# Patient Record
Sex: Male | Born: 1960 | Race: White | Hispanic: No | Marital: Single | Smoking: Current every day smoker
Health system: Southern US, Community
[De-identification: ages and names within clinical notes are randomized; demographics above are authoritative.]

## PROBLEM LIST (undated history)

## (undated) DIAGNOSIS — F329 Major depressive disorder, single episode, unspecified: Secondary | ICD-10-CM

## (undated) DIAGNOSIS — E785 Hyperlipidemia, unspecified: Secondary | ICD-10-CM

## (undated) DIAGNOSIS — K219 Gastro-esophageal reflux disease without esophagitis: Secondary | ICD-10-CM

## (undated) DIAGNOSIS — F32A Depression, unspecified: Secondary | ICD-10-CM

## (undated) DIAGNOSIS — H269 Unspecified cataract: Secondary | ICD-10-CM

## (undated) DIAGNOSIS — F419 Anxiety disorder, unspecified: Secondary | ICD-10-CM

## (undated) DIAGNOSIS — I10 Essential (primary) hypertension: Secondary | ICD-10-CM

## (undated) HISTORY — DX: Essential (primary) hypertension: I10

## (undated) HISTORY — DX: Depression, unspecified: F32.A

## (undated) HISTORY — DX: Hyperlipidemia, unspecified: E78.5

## (undated) HISTORY — DX: Major depressive disorder, single episode, unspecified: F32.9

## (undated) HISTORY — DX: Gastro-esophageal reflux disease without esophagitis: K21.9

## (undated) HISTORY — DX: Anxiety disorder, unspecified: F41.9

## (undated) HISTORY — DX: Unspecified cataract: H26.9

---

## 2010-04-06 ENCOUNTER — Emergency Department (HOSPITAL_COMMUNITY)
Admission: EM | Admit: 2010-04-06 | Discharge: 2010-04-06 | Disposition: A | Discharge status: Home / Self Care | Payer: PRIVATE HEALTH INSURANCE | Attending: Emergency Medicine | Admitting: Emergency Medicine

## 2010-04-06 DIAGNOSIS — T07XXXA Unspecified multiple injuries, initial encounter: Secondary | ICD-10-CM | POA: Insufficient documentation

## 2010-04-06 DIAGNOSIS — S91009A Unspecified open wound, unspecified ankle, initial encounter: Secondary | ICD-10-CM | POA: Insufficient documentation

## 2010-04-06 DIAGNOSIS — IMO0001 Reserved for inherently not codable concepts without codable children: Secondary | ICD-10-CM | POA: Insufficient documentation

## 2010-04-06 DIAGNOSIS — S81009A Unspecified open wound, unspecified knee, initial encounter: Secondary | ICD-10-CM | POA: Insufficient documentation

## 2010-06-09 ENCOUNTER — Emergency Department (HOSPITAL_COMMUNITY): Payer: PRIVATE HEALTH INSURANCE

## 2010-06-09 ENCOUNTER — Observation Stay (HOSPITAL_COMMUNITY)
Admission: EM | Admit: 2010-06-09 | Discharge: 2010-06-10 | DRG: 206 | Disposition: A | Discharge status: Home / Self Care | Payer: PRIVATE HEALTH INSURANCE | Attending: Family Medicine | Admitting: Family Medicine

## 2010-06-09 DIAGNOSIS — J4489 Other specified chronic obstructive pulmonary disease: Secondary | ICD-10-CM | POA: Insufficient documentation

## 2010-06-09 DIAGNOSIS — I10 Essential (primary) hypertension: Secondary | ICD-10-CM | POA: Insufficient documentation

## 2010-06-09 DIAGNOSIS — M940 Chondrocostal junction syndrome [Tietze]: Principal | ICD-10-CM | POA: Insufficient documentation

## 2010-06-09 DIAGNOSIS — J449 Chronic obstructive pulmonary disease, unspecified: Secondary | ICD-10-CM | POA: Insufficient documentation

## 2010-06-09 DIAGNOSIS — F17200 Nicotine dependence, unspecified, uncomplicated: Secondary | ICD-10-CM | POA: Insufficient documentation

## 2010-06-09 DIAGNOSIS — R0789 Other chest pain: Secondary | ICD-10-CM

## 2010-06-09 DIAGNOSIS — R079 Chest pain, unspecified: Secondary | ICD-10-CM | POA: Insufficient documentation

## 2010-06-09 DIAGNOSIS — E785 Hyperlipidemia, unspecified: Secondary | ICD-10-CM | POA: Insufficient documentation

## 2010-06-09 LAB — COMPREHENSIVE METABOLIC PANEL
ALT: 23 U/L (ref 0–53)
AST: 24 U/L (ref 0–37)
Alkaline Phosphatase: 96 U/L (ref 39–117)
CO2: 32 mEq/L (ref 19–32)
GFR calc Af Amer: >60 mL/min (ref >60)
GFR calc non Af Amer: >60 mL/min (ref >60)
Glucose, Bld: 101 mg/dL — ABNORMAL HIGH (ref 70–99)
Potassium: 3.8 mEq/L (ref 3.5–5.1)
Sodium: 140 mEq/L (ref 135–145)
Total Protein: 6.8 g/dL (ref 6.0–8.3)

## 2010-06-09 LAB — CK TOTAL AND CKMB (NOT AT ARMC)
Relative Index: 1 (ref 0.0–2.5)
Total CK: 113 U/L (ref 7–232)

## 2010-06-09 LAB — CBC
HCT: 42.1 % (ref 39.0–52.0)
MCHC: 35.4 g/dL (ref 30.0–36.0)
MCV: 90.9 fL (ref 78.0–100.0)
RDW: 13.6 % (ref 11.5–15.5)

## 2010-06-09 LAB — POCT CARDIAC MARKERS

## 2010-06-09 LAB — TROPONIN I: Troponin I: <0.3 ng/mL (ref <0.30)

## 2010-06-10 LAB — BASIC METABOLIC PANEL
BUN: 12 mg/dL (ref 6–23)
Chloride: 105 mEq/L (ref 96–112)
GFR calc Af Amer: >60 mL/min (ref >60)
GFR calc non Af Amer: >60 mL/min (ref >60)
Potassium: 3.8 mEq/L (ref 3.5–5.1)
Sodium: 140 mEq/L (ref 135–145)

## 2010-06-10 LAB — LIPID PANEL
Cholesterol: 189 mg/dL (ref 0–200)
Total CHOL/HDL Ratio: 7 RATIO
VLDL: 38 mg/dL (ref 0–40)

## 2010-06-10 LAB — CBC
HCT: 40.6 % (ref 39.0–52.0)
MCHC: 34.5 g/dL (ref 30.0–36.0)
RDW: 13.6 % (ref 11.5–15.5)
WBC: 9.9 10*3/uL (ref 4.0–10.5)

## 2010-06-10 LAB — CARDIAC PANEL(CRET KIN+CKTOT+MB+TROPI)
Relative Index: INVALID (ref 0.0–2.5)
Total CK: 98 U/L (ref 7–232)
Troponin I: <0.3 ng/mL (ref <0.30)

## 2010-06-10 LAB — HEMOGLOBIN A1C
Hgb A1c MFr Bld: 5.3 % (ref <5.7)
Mean Plasma Glucose: 105 mg/dL (ref <117)

## 2010-06-11 NOTE — Discharge Summary (Signed)
[name], [name]                ACCOUNT NO.:  [id number]  MEDICAL RECORD NO.:  [id number]           PATIENT TYPE:  I  LOCATION:  3732                         FACILITY:  [facility]  PHYSICIAN:  Leighton Roach Fatim Vanderschaaf, M.D.DATE OF BIRTH:  [birth year]  DATE OF ADMISSION:  06/09/2010 DATE OF DISCHARGE:  06/10/2010                              DISCHARGE SUMMARY   PRIMARY CARE PROVIDER:  None.  The patient was seen at [facility] 2 years ago.  REASON FOR HOSPITALIZATION:  Chest pain, rule out acute coronary syndrome.  DISCHARGE DIAGNOSES: 1. Costochondritis. 2. Hypertension.  MEDICATIONS:  New medications: 1. Hydrochlorothiazide 12.5 mg p.o. q.a.m. 2. Nitroglycerin sublingual 0.4 mg p.r.n. chest pain.  May repeat     every 5 minutes up to three times. 3. Nicotine 21 mg per 24-hour patch transdermally daily.  Continue home medications: 1. Aspirin 81 mg p.o. daily 2. Fish oil 1000 mg p.o. daily. 3. Multivitamins 1 tablet p.o. daily.  PROCEDURES:  Chest x-ray showed COPD with no acute cardiopulmonary disease.  PERTINENT LABORATORY DATA:  Point-of-care cardiac markers negative x2. Cardiac enzymes negative x2.  Hemoglobin A1c 5.3.  Lipid profile, cholesterol 189, triglycerides 191, HDL 27, LDL 124, VLDL 38.  BRIEF HOSPITAL COURSE:  This is a 50 year old male with a history of tobacco abuse and hyperlipidemia who presented with chest pain and pressure. 1. Chest pain and pressure.  Likely secondary to costochondritis.     Acute coronary syndrome was ruled out with negative cardiac enzymes     x2 and negative point-of-care cardiac markers x2.  His ECG was     unchanged from admission and his ECGs x2 were unchanged.  The     patient presented with palpable and reproducible substernal pain     that was worse with right arm movement.  His symptoms were     attributed to costochondritis especially in light of his negative     ACS workup.  The patient has a history of trauma to that  area.  The     patient has a very active lifestyle including riding motorcycles     and go-kart.  The patient's pain was well controlled with p.r.n.     ibuprofen. 2. Questionable history of hypertension, and according to the patient     left ventricular enlargement diagnosed 3-4 years ago at a     cardiologist's office.  The patient was not on any antihypertensive     medications at the time of admission.  His blood pressures,     however, were elevated during his hospitalization 160s-170s over     90s-110s.  The patient was asked to start hydrochlorothiazide at     that time of admission and to follow up with his PCP regarding his     blood pressure.  The patient has a reported history of white coat     hypertension.  Since ACS was ruled out, the patient's left     ventricular hypertrophy was not dealt into.  The patient was     observed on telemetry overnight and did not have any events and his  ECG did not show signs of left ventricular hypertrophy.  The     patient was asked to follow up with his PCP regarding this issue     for a possible stress test. 3. Tobacco abuse.  The take the patient is a one pack-per-day smoker.     He was counseled multiple times during the admission regarding     this.  He did well on a nicotine patch during his hospitalization     and was given a prescription for this at the time of discharge. 4. COPD on chest x-ray.  The patient was asymptomatic.  We will notify     his PCP of this finding. 5. History of hyperlipidemia, intolerance of STATINS.  The patient's     lipid panel showed a moderately elevated LDL of 124.  Currently,     the patient is diet controlled for his hyperlipidemia.  The results     of this test will be known to his PCP.  DISCHARGE INSTRUCTIONS: 1. The patient was asked to follow up at [facility] in the     next 1-2 weeks. 2. The patient was advised to come to the emergency room if the     patient experienced any more  chest pain symptoms that is relieved     by the nitroglycerin for which he received a prescription.  FOLLOWUP ISSUES: 1. Please follow up on the patient's blood pressure.  He was started     on HCTZ after his hospitalization due to elevated blood pressures.     His potassium during his hospitalization was normal. 2. Please consider the patient for a stress test if one was not done     recently to completely rule out angina.    ______________________________ Priscella Mann, MD   ______________________________ Leighton Roach Isaac Lacson, M.D.    AO/MEDQ  D:  06/10/2010  T:  06/11/2010  Job:  [id number]  cc:   [facility] on [address].  Electronically Signed by Priscella Mann MD on 06/11/2010 03:25:59 PM Electronically Signed by Acquanetta Belling M.D. on 06/11/2010 03:53:27 PM

## 2010-06-17 NOTE — H&P (Signed)
NAME:  [name], [name] NO.:  [id number]  MEDICAL RECORD NO.:  [id number]           PATIENT TYPE:  I  LOCATION:  3732                         FACILITY:  [facility]  PHYSICIAN:  Paula Compton, MD        DATE OF BIRTH:  July 16, 1960  DATE OF ADMISSION:  06/09/2010 DATE OF DISCHARGE:                             HISTORY & PHYSICAL   PRIMARY CARE PHYSICIAN:  [facility].  CHIEF COMPLAINT:  Chest pain pressure.  HISTORY OF PRESENT ILLNESS:  The patient is a 50 year old man with past medical history of left ventricular hypertrophy and tobacco abuse who presented to the emergency department with chest pressure and pain.  The patient states that pain pressure/pressure is in sternum area, began around 12:30 when walking at work.  No shortness of breath with pain. No diaphoresis.  No nausea or vomiting.  Pain initially was rated at 6/10, lasted throughout the afternoon with no improvement, so he came to the emergency department for evaluation.  After nitro sublingual x1, minimal improvement in pain to 3/10.  Pain is worse with movement especially with reaching right arm across chest, some increase in the chest pressure with deep inspiration.  REVIEW OF SYSTEMS:  Negative for shortness of breath and diaphoresis. No fever.  No dry skin.  No urine problems.  No dark or bloody stools. No fever, no cough, no cold symptoms.  ALLERGIES:  No known drug allergies.  PAST MEDICAL HISTORY/SURGICAL HISTORY: 1. Hyperlipidemia.  The patient has tried to take statin in the past,     but stopped secondary to myalgias. 2. Enlarged left ventricle, diagnosed on stress test by Cardiology     approximately 3-4 years ago.  MEDICATIONS:  Aspirin daily.  FAMILY HISTORY:  Mother healthy.  Father with pancreatitis, alcoholic, tobacco abuse, and TIA.  Siblings healthy.  SOCIAL HISTORY:  One drink of alcohol every 3 months, 1-2 packs per day of cigarettes.  No drug use.  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  He is  single, has no children.  PHYSICAL EXAMINATION:  VITAL SIGNS:  Temperature 98.5, blood pressure 159/101, heart rate 76, respiratory rate 18, pulse ox 98% on room air. GENERAL:  No acute distress, resting quietly on stretcher. RESPIRATORY:  Clear to auscultation bilaterally.  Good air movement.  No wheezing. CARDIOVASCULAR:  Regular rate and rhythm.  No murmurs, rubs, or gallops. EXTREMITIES:  No edema.  No calf pain. ABDOMEN:  Soft, nontender, nondistended. HEENT:  Mucous membranes moist.  Pupils equal, round, and reactive to light and accommodation. NEURO:  Alert and oriented x3.  Moving all four extremities.  LABORATORIES AND STUDIES:  Sodium 140, potassium 3.8, chloride 102, bicarb 32, BUN 11, creatinine 0.91, glucose 101.  Total bili 0.8, alk phos 96, AST 24, ALT 23, total protein 6.8, albumin 3.9.  Calcium 9.6. Cardiac enzyme, point of care, negative.  White blood cells 8.9, hemoglobin 14.9, platelets 236.  Chest x-rays, COPD.  No acute cardiopulmonary disease.  EKG, normal sinus rhythm with no ST changes, left ventricular hypertrophy.  ASSESSMENT AND PLAN:  The patient is a 50 year old man with past medical history of  tobacco abuse and left ventricular hypertrophy admitted for chest pressure and pain. 1. Chest pain and pressure.  The patient's risk factors for cardiac     disease are ventricular hypertrophy, tobacco abuse, and     questionable history of hypertension.  We will admit to rule out     cardiac event overnight.  We will cycle cardiac enzymes, monitor on     telemetry, recheck a.m. EKG, and risk stratify with lipid panel and     A1c.  We will treat chest pain with Percocet p.r.n. 2. Hypertension, questionable past history and left intraocular     hypertrophy.  No history of hypertension, but the patient reports     white coat hypertension at previous MD visit.  Also, the patient     states he was told that he has left ventricular enlargement.  He     had a workup  by outpatient Cardiology approximately 3 or 4 years     ago, does not remember the name of the cardiologist.  States that     [name] has the name of cardiologist in his record.  The     patient is unsure if he has never had cardiac echocardiogram.  We     will need to get the name of cardiologist from his primary care     physician to get records and possibly get set up for reconsult.  We     will decide inpatient versus outpatient after laboratories and     recheck of EKG complete.  We will monitor blood pressure, consider     starting angiotensin-converting enzyme inhibitor if increased. 3. Tobacco abuse, nicotine patch.  Encouraged smoking cessation.     Chronic obstructive pulmonary disease on chest x-ray.  We will need     PFTs as an outpatient possibly to further evaluate chest x-ray     findings. 4. History of hyperlipidemia, recheck lipid panel, on statin in the     past, but stopped secondary to myalgias. 5. Fluids, electrolytes, nutrition/gastrointestinal, heart-healthy     diet. 6. Prophylaxis, ambulate and heparin. 7. Disposition:  Pending.  Completed workup.  Possible discharge     tomorrow if workup negative and able to set up outpatient followup     with Cardiology.     Ellin Mayhew, MD   ______________________________ Paula Compton, MD    DC/MEDQ  D:  06/09/2010  T:  06/10/2010  Job:  [id number]  Electronically Signed by Ellin Mayhew  on 06/17/2010 08:38:12 AM Electronically Signed by Paula Compton MD on 06/17/2010 04:54:09 PM

## 2011-04-29 ENCOUNTER — Telehealth: Payer: Self-pay

## 2011-04-29 NOTE — Telephone Encounter (Signed)
.  umfc The patient called to request refill of his hydrochlorothiazide Rx.  Patient stated the pharmacy told him he needs to call physician.  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  Patient pharmacy is [facility] on [address].

## 2011-04-30 ENCOUNTER — Other Ambulatory Visit: Payer: Self-pay | Admitting: Internal Medicine

## 2011-04-30 MED ORDER — HYDROCHLOROTHIAZIDE 12.5 MG PO CAPS: 12.5000 mg | ORAL_CAPSULE | Freq: Every day | ORAL | Status: DC

## 2011-04-30 NOTE — Telephone Encounter (Signed)
Rx rf'd x 30 days, LMOM advising patient to RTC before out.

## 2011-05-27 ENCOUNTER — Ambulatory Visit (INDEPENDENT_AMBULATORY_CARE_PROVIDER_SITE_OTHER): Payer: PRIVATE HEALTH INSURANCE | Admitting: Family Medicine

## 2011-05-27 ENCOUNTER — Encounter: Payer: Self-pay | Admitting: Family Medicine

## 2011-05-27 VITALS — BP 149/94 | HR 66 | Temp 97.1°F | Resp 16 | Ht 64.0 in | Wt 143.0 lb

## 2011-05-27 DIAGNOSIS — I10 Essential (primary) hypertension: Secondary | ICD-10-CM | POA: Insufficient documentation

## 2011-05-27 DIAGNOSIS — R1084 Generalized abdominal pain: Secondary | ICD-10-CM

## 2011-05-27 DIAGNOSIS — E785 Hyperlipidemia, unspecified: Secondary | ICD-10-CM

## 2011-05-27 LAB — COMPREHENSIVE METABOLIC PANEL
ALT: 41 U/L (ref 0–53)
AST: 34 U/L (ref 0–37)
Albumin: 4.4 g/dL (ref 3.5–5.2)
Alkaline Phosphatase: 82 U/L (ref 39–117)
Calcium: 8.9 mg/dL (ref 8.4–10.5)
Chloride: 102 mEq/L (ref 96–112)
Potassium: 4.1 mEq/L (ref 3.5–5.3)
Sodium: 139 mEq/L (ref 135–145)
Total Protein: 6.8 g/dL (ref 6.0–8.3)

## 2011-05-27 LAB — LIPID PANEL
HDL: 36 mg/dL — ABNORMAL LOW (ref >39)
LDL Cholesterol: 100 mg/dL — ABNORMAL HIGH (ref 0–99)

## 2011-05-27 MED ORDER — HYDROCHLOROTHIAZIDE 12.5 MG PO CAPS: 12.5000 mg | ORAL_CAPSULE | Freq: Every day | ORAL | Status: DC

## 2011-05-27 MED ORDER — ROSUVASTATIN CALCIUM 10 MG PO TABS: 10.0000 mg | ORAL_TABLET | Freq: Every day | ORAL | Status: DC

## 2011-05-27 NOTE — Progress Notes (Signed)
  Subjective:    Patient ID: [name], male    DOB: Jun 10, 1960, 51 y.o.   MRN: [id number]  HPI [name] is a 51 y.o. male HTN - Outside bp's - 117/75 up to 140/85.  Usually numbers look good. Taking hctz at night.   Less exercise recently.  Fatigue at times. No nocturia.  Stomachaches this week at times. No nocturia.  Few awakenings at night.  No fever, no vomiting, occasional looser stools, and small pellets.  Gained about 9 pounds past 6 months.  Hyperlipidemia - no new side effects with chol med.    Review of Systems  Constitutional: Negative for fever, chills, fatigue and unexpected weight change.  Eyes: Negative for visual disturbance.  Respiratory: Negative for cough, chest tightness and shortness of breath.   Cardiovascular: Negative for chest pain, palpitations and leg swelling.  Gastrointestinal: Positive for diarrhea. Negative for nausea, vomiting, abdominal pain and blood in stool.  Genitourinary: Negative for urgency, frequency and difficulty urinating.  Musculoskeletal: Negative for myalgias.  Neurological: Negative for dizziness, light-headedness and headaches.       Objective:   Physical Exam  Constitutional: He is oriented to person, place, and time. He appears well-developed and well-nourished.  HENT:  Head: Normocephalic and atraumatic.  Eyes: EOM are normal. Pupils are equal, round, and reactive to light.  Neck: No JVD present. Carotid bruit is not present.  Cardiovascular: Normal rate, regular rhythm and normal heart sounds.   No murmur heard. Pulmonary/Chest: Effort normal and breath sounds normal. He has no rales.  Abdominal: Soft. Bowel sounds are normal. He exhibits no distension. There is no tenderness. There is no rebound.  Musculoskeletal: He exhibits no edema.  Neurological: He is alert and oriented to person, place, and time.  Skin: Skin is warm and dry.  Psychiatric: He has a normal mood and affect.          Assessment &  Plan:  [name] is a 51 y.o. male 1. Hyperlipidemia  Lipid panel  2. HTN (hypertension)  Comprehensive metabolic panel   HTN - Borderline BP here - ? White coat component.  Home readings ok.  Can bring monitor to office visit to correlate.   Hyperlipidemia - check CMP, lipids. Refilled Crestor at 10mg .  Intermittent abd pain x 1 week - constipation with small stools likely.  incr fiber slowly and water, exercise.  If any increase of pain, n/v, or any worsening - RTC.  Return to the clinic or go to the nearest emergency room if symptoms worsen or new symptoms occur.

## 2011-10-21 ENCOUNTER — Ambulatory Visit (INDEPENDENT_AMBULATORY_CARE_PROVIDER_SITE_OTHER): Payer: Commercial Managed Care - PPO | Admitting: Family Medicine

## 2011-10-21 ENCOUNTER — Encounter: Payer: Self-pay | Admitting: Family Medicine

## 2011-10-21 VITALS — BP 138/92 | HR 72 | Temp 98.0°F | Resp 16 | Ht 63.5 in | Wt 136.0 lb

## 2011-10-21 DIAGNOSIS — R0683 Snoring: Secondary | ICD-10-CM

## 2011-10-21 DIAGNOSIS — K219 Gastro-esophageal reflux disease without esophagitis: Secondary | ICD-10-CM

## 2011-10-21 DIAGNOSIS — Z0000 Encounter for general adult medical examination without abnormal findings: Secondary | ICD-10-CM

## 2011-10-21 DIAGNOSIS — Z720 Tobacco use: Secondary | ICD-10-CM

## 2011-10-21 DIAGNOSIS — R05 Cough: Secondary | ICD-10-CM

## 2011-10-21 DIAGNOSIS — R5383 Other fatigue: Secondary | ICD-10-CM

## 2011-10-21 DIAGNOSIS — F17200 Nicotine dependence, unspecified, uncomplicated: Secondary | ICD-10-CM

## 2011-10-21 LAB — COMPREHENSIVE METABOLIC PANEL
ALT: 28 U/L (ref 0–53)
AST: 24 U/L (ref 0–37)
Albumin: 4.5 g/dL (ref 3.5–5.2)
CO2: 27 mEq/L (ref 19–32)
Calcium: 9.7 mg/dL (ref 8.4–10.5)
Chloride: 100 mEq/L (ref 96–112)
Potassium: 3.9 mEq/L (ref 3.5–5.3)

## 2011-10-21 LAB — CBC WITH DIFFERENTIAL/PLATELET
Basophils Absolute: 0 10*3/uL (ref 0.0–0.1)
Eosinophils Relative: 3 % (ref 0–5)
HCT: 44.2 % (ref 39.0–52.0)
Lymphocytes Relative: 35 % (ref 12–46)
Lymphs Abs: 3.5 10*3/uL (ref 0.7–4.0)
MCV: 91.1 fL (ref 78.0–100.0)
Monocytes Absolute: 0.9 10*3/uL (ref 0.1–1.0)
Neutro Abs: 5.4 10*3/uL (ref 1.7–7.7)
Platelets: 270 10*3/uL (ref 150–400)
RBC: 4.85 MIL/uL (ref 4.22–5.81)
RDW: 13.4 % (ref 11.5–15.5)
WBC: 10.1 10*3/uL (ref 4.0–10.5)

## 2011-10-21 LAB — LIPID PANEL: Cholesterol: 150 mg/dL (ref 0–200)

## 2011-10-21 LAB — TSH: TSH: 1.174 u[IU]/mL (ref 0.350–4.500)

## 2011-10-21 NOTE — Progress Notes (Signed)
Subjective:    Patient ID: [name], male    DOB: 10-07-1960, 51 y.o.   MRN: [id number]  HPI [name] is a 51 y.o. male Here for cpe.  Multiple other concerns noted on phs - will rtc to discuss in further detail.    Colonoscopy 10/2010 - follow up due in 5 years.   Still smoking 2 packs per day.  Not interested in quitting at this point.   Hyperlipidemia - stopped crestor after having pain in shoulders form mountain bike riding. Pain resolved in few months. Took self off crestor in July, and just restarted few weeks ago.  had heartburn with crestor - any cheolesterol medicine gives heartburn? Has heartburn anyway.  Started zantac - taking zantac past couple of nights - had been off for awhile.  Cough - episodic.  Insomnia - waking up at times.  Snoring more at times.  Wakes due to snoring at times.  Unknown if pausing, but fatigued during day.    Review of Systems Per 13 point ros on phs.  See specific concerns. Plan on discussing these in more detail at future ov's.    Objective:   Physical Exam  Constitutional: He is oriented to person, place, and time. He appears well-developed and well-nourished.  HENT:  Head: Normocephalic and atraumatic.  Right Ear: External ear normal.  Left Ear: External ear normal.  Mouth/Throat: Oropharynx is clear and moist.  Eyes: Conjunctivae normal and EOM are normal. Pupils are equal, round, and reactive to light.  Neck: Normal range of motion. Neck supple. No thyromegaly present.  Cardiovascular: Normal rate, regular rhythm, normal heart sounds and intact distal pulses.   Pulmonary/Chest: Effort normal and breath sounds normal. No respiratory distress. He has no wheezes.  Abdominal: Soft. He exhibits no distension. There is no tenderness. Hernia confirmed negative in the right inguinal area and confirmed negative in the left inguinal area.  Genitourinary: Prostate normal.  Musculoskeletal: Normal range of motion. He exhibits  no edema and no tenderness.  Lymphadenopathy:    He has no cervical adenopathy.  Neurological: He is alert and oriented to person, place, and time. He has normal reflexes.  Skin: Skin is warm and dry.  Psychiatric: He has a normal mood and affect. His behavior is normal.        Assessment & Plan:  [name] is a 51 y.o. male 1. Annual physical exam  CBC with Differential, Comprehensive metabolic panel, Lipid panel, PSA, TSH  2. Cough    3. GERD (gastroesophageal reflux disease)    4. Snoring  Ambulatory referral to Sleep Studies  5. Fatigue  Ambulatory referral to Sleep Studies, TSH  6. Tobacco abuse     CPE - fasting labs this am - check CMP, lipids, tsh, PSA (discussed with pt and desired this testing).  Refused flu shot.   Hyperlipidemia - nonadherent to meds as above.  encouraged to advise [place] in the future if any possible side effects of meds as I am unable to address these if I don't know about them.  GERD - cont zantac QD - can increase to 150 up to BID, avoid trigger foods. if needed or change to prilosec if not improving.  Follow up in the next month to discuss further.  Cough - multiple causes - including tobacco use, and reflux. Increase zantac 150mg  BID as above - follow up in next 1 month to discuss.  Insomnia - ok to continue melatonin.  Snoring - ddx includes  OSA, check sleep study, then follow up after results.  Sleep hygiene may also need to be discussed.   Tobacco abuse - discussed risks and likely contribution to above.  Not ready to quit at this point.   Plan on follow up as above in next 1 month.  Patient Instructions  Ok to increase zantac up to 150mg  twice per day.  If this is not helping, can try prilosec over the counter. Recheck within the next month to discuss your cough and possibly some of the other concerns from today's visit. Return to the clinic or go to the nearest emergency room if any of your symptoms worsen or new symptoms occur. We  will refer you for a sleep study. Your should receive a call or letter about your lab results within the next week to 10 days.

## 2011-10-21 NOTE — Patient Instructions (Addendum)
Ok to increase zantac up to 150mg  twice per day.  If this is not helping, can try prilosec over the counter. Recheck within the next month to discuss your cough and possibly some of the other concerns from today's visit. Return to the clinic or go to the nearest emergency room if any of your symptoms worsen or new symptoms occur. We will refer you for a sleep study. Your should receive a call or letter about your lab results within the next week to 10 days.

## 2011-10-22 ENCOUNTER — Encounter: Payer: Self-pay | Admitting: Family Medicine

## 2011-10-22 LAB — PSA: PSA: 0.79 ng/mL (ref <=4.00)

## 2011-12-05 ENCOUNTER — Encounter: Payer: Self-pay | Admitting: Family Medicine

## 2011-12-05 ENCOUNTER — Ambulatory Visit (INDEPENDENT_AMBULATORY_CARE_PROVIDER_SITE_OTHER): Payer: Commercial Managed Care - PPO | Admitting: Family Medicine

## 2011-12-05 VITALS — BP 132/84 | HR 68 | Temp 98.1°F | Resp 16 | Ht 63.5 in | Wt 135.6 lb

## 2011-12-05 DIAGNOSIS — E7800 Pure hypercholesterolemia, unspecified: Secondary | ICD-10-CM

## 2011-12-05 DIAGNOSIS — I10 Essential (primary) hypertension: Secondary | ICD-10-CM

## 2011-12-05 DIAGNOSIS — G4700 Insomnia, unspecified: Secondary | ICD-10-CM

## 2011-12-05 DIAGNOSIS — E785 Hyperlipidemia, unspecified: Secondary | ICD-10-CM

## 2011-12-05 DIAGNOSIS — K219 Gastro-esophageal reflux disease without esophagitis: Secondary | ICD-10-CM

## 2011-12-05 DIAGNOSIS — R05 Cough: Secondary | ICD-10-CM

## 2011-12-05 MED ORDER — HYDROCHLOROTHIAZIDE 12.5 MG PO CAPS: 12.5000 mg | ORAL_CAPSULE | Freq: Every day | ORAL | Status: DC

## 2011-12-05 MED ORDER — ROSUVASTATIN CALCIUM 10 MG PO TABS: 10.0000 mg | ORAL_TABLET | Freq: Every day | ORAL | Status: DC

## 2011-12-05 NOTE — Patient Instructions (Addendum)
Recheck in 3 months for fasting labs.- sooner if any new/worsening symptoms.

## 2011-12-05 NOTE — Progress Notes (Signed)
Subjective:    Patient ID: [name], male    DOB: [birth year], 51 y.o.   MRN: [id number]  HPI [name] is a 51 y.o. male S/p CPE 10/21/11:  Hyperlipidemia - nonadherent to meds at that time. Planned to restart Crestor but to advise me if any intolerances or side effects.  Had been taking 1/2 of Crestor each day for past 3 weeks at last OV.   Results for orders placed in visit on 10/21/11  CBC WITH DIFFERENTIAL      Component Value Range   WBC 10.1  4.0 - 10.5 K/uL   RBC 4.85  4.22 - 5.81 MIL/uL   Hemoglobin 15.7  13.0 - 17.0 g/dL   HCT 78.2  95.6 - 21.3 %   MCV 91.1  78.0 - 100.0 fL   MCH 32.4  26.0 - 34.0 pg   MCHC 35.5  30.0 - 36.0 g/dL   RDW 08.6  57.8 - 46.9 %   Platelets 270  150 - 400 K/uL   Neutrophils Relative 53  43 - 77 %   Neutro Abs 5.4  1.7 - 7.7 K/uL   Lymphocytes Relative 35  12 - 46 %   Lymphs Abs 3.5  0.7 - 4.0 K/uL   Monocytes Relative 9  3 - 12 %   Monocytes Absolute 0.9  0.1 - 1.0 K/uL   Eosinophils Relative 3  0 - 5 %   Eosinophils Absolute 0.3  0.0 - 0.7 K/uL   Basophils Relative 0  0 - 1 %   Basophils Absolute 0.0  0.0 - 0.1 K/uL   Smear Review Criteria for review not met    COMPREHENSIVE METABOLIC PANEL      Component Value Range   Sodium 138  135 - 145 mEq/L   Potassium 3.9  3.5 - 5.3 mEq/L   Chloride 100  96 - 112 mEq/L   CO2 27  19 - 32 mEq/L   Glucose, Bld 79  70 - 99 mg/dL   BUN 12  6 - 23 mg/dL   Creat 6.29  5.28 - 4.13 mg/dL   Total Bilirubin 1.1  0.3 - 1.2 mg/dL   Alkaline Phosphatase 80  39 - 117 U/L   AST 24  0 - 37 U/L   ALT 28  0 - 53 U/L   Total Protein 6.7  6.0 - 8.3 g/dL   Albumin 4.5  3.5 - 5.2 g/dL   Calcium 9.7  8.4 - 24.4 mg/dL  LIPID PANEL      Component Value Range   Cholesterol 150  0 - 200 mg/dL   Triglycerides 010 (*) <150 mg/dL   HDL 32 (*) >27 mg/dL   Total CHOL/HDL Ratio 4.7     VLDL 41 (*) 0 - 40 mg/dL   LDL Cholesterol 77  0 - 99 mg/dL  PSA      Component Value Range   PSA 0.79  <=4.00  ng/mL  TSH      Component Value Range   TSH 1.174  0.350 - 4.500 uIU/mL  Has continued taking 1/2 of Crestor each day. No new myalgias.   HTN - outside BP's 135/88. No new side effects with meds - needs refills today.   GERD - with intermittent cough - possibly multifactorial with tobacco abuse . Recommended increasing Zantac to 150 mg BID, avoid trigger foods,change to prilosec if not improving.  Was only taking zantac as needed - now everyday, no further cough. Heartburn is  better.   Insomnia with snoring - ddx includes OSA, referred for sleep study, but did not have done due to out of pocket cost.  No recent episodes of trouble falling asleep. Snoring has resolved. - improved with gerd tx.   Review of Systems  Constitutional: Negative for fatigue and unexpected weight change.  Eyes: Negative for visual disturbance.  Respiratory: Negative for cough, chest tightness and shortness of breath.   Cardiovascular: Positive for chest pain (had on r side - but gone now.  thought due to gas. ). Negative for palpitations and leg swelling.  Gastrointestinal: Negative for abdominal pain and blood in stool.  Musculoskeletal: Negative for myalgias.  Neurological: Negative for dizziness, light-headedness and headaches.       Objective:   Physical Exam  Vitals reviewed. Constitutional: He is oriented to person, place, and time. He appears well-developed and well-nourished.  HENT:  Head: Normocephalic and atraumatic.  Eyes: EOM are normal. Pupils are equal, round, and reactive to light.  Neck: No JVD present. Carotid bruit is not present.  Cardiovascular: Normal rate, regular rhythm and normal heart sounds.   No murmur heard. Pulmonary/Chest: Effort normal and breath sounds normal. No respiratory distress. He has no wheezes. He has no rales.  Musculoskeletal: He exhibits no edema.  Neurological: He is alert and oriented to person, place, and time.  Skin: Skin is warm and dry.  Psychiatric: He  has a normal mood and affect.      Assessment & Plan:  [name] is a 51 y.o. male 1. HTN (hypertension)    2. Cough    3. GERD (gastroesophageal reflux disease)    4. Insomnia  hydrochlorothiazide (MICROZIDE) 12.5 MG capsule  5. Hyperlipidemia  rosuvastatin (CRESTOR) 10 MG tablet   Htn - stable - hctz refilled. Recent lytes ok.   Cough - improved -likely due to GERD, and this is now under better control.  Continue daily Zantac.rtc if recurs and would consider CXR.   GERD - improved. Cont Zantac.  Insomnia - improved. Consider sleep study if snoring recurs, or daytime fatigue.  Can discuss further over next few months.  Hyperlipidemia - triglycerides high, but LDL good at last check with 1/2 dose of Crestor 10mg .  Can continue this same dose and recheck in few months.   Patient Instructions  Recheck in 3 months for fasting labs.- sooner if any new/worsening symptoms.

## 2011-12-06 ENCOUNTER — Encounter: Payer: Self-pay | Admitting: Family Medicine

## 2012-04-02 ENCOUNTER — Encounter: Payer: Self-pay | Admitting: Family Medicine

## 2012-04-02 ENCOUNTER — Ambulatory Visit (INDEPENDENT_AMBULATORY_CARE_PROVIDER_SITE_OTHER): Payer: Commercial Managed Care - PPO | Admitting: Family Medicine

## 2012-04-02 VITALS — BP 139/83 | HR 65 | Temp 97.8°F | Resp 16 | Ht 64.25 in | Wt 137.0 lb

## 2012-04-02 DIAGNOSIS — E785 Hyperlipidemia, unspecified: Secondary | ICD-10-CM

## 2012-04-02 DIAGNOSIS — R05 Cough: Secondary | ICD-10-CM

## 2012-04-02 MED ORDER — HYDROCHLOROTHIAZIDE 12.5 MG PO CAPS: 12.5000 mg | ORAL_CAPSULE | Freq: Every day | ORAL | Status: DC

## 2012-04-02 MED ORDER — ROSUVASTATIN CALCIUM 10 MG PO TABS: 5.0000 mg | ORAL_TABLET | Freq: Every day | ORAL | Status: DC

## 2012-04-02 NOTE — Patient Instructions (Signed)
Your should receive a call or letter about your lab results within the next week to 10 days.  Continue your same medicines, including the zantac.  If the cough is not improving, or back pain worsens - recheck in office.  Return to the clinic or go to the nearest emergency room if any of your symptoms worsen or new symptoms occur.

## 2012-04-02 NOTE — Progress Notes (Signed)
Subjective:    Patient ID: [name], male    DOB: 08/13/1960, 52 y.o.   MRN: [id number]  HPI [name] is a 52 y.o. male See last ov 12/05/11.   Htn - stable last ov. hctz refilled. Creat 0.97 in 09/2011.  Home Bp's 125-135/75-85.   Cough,  improved last ov. likely due to GERD, and this was under better control.  Continued daily Zantac. Had been resolved until 2 weeks ago - dry cough, but resolving this past weekend - feels like this is subsiding.  No fever, no dypspnea, no wt loss, nonproductive cough.   Insomnia - improved. Considered sleep study prior.  Taking otc melatonin to sleep - working to help sleep throughout week.  No daytime fatigue. No PND. Zantac helps to prevent heartburn waking him up at night.   Hyperlipidemia - triglycerides high, but LDL good at last check with 1/2 dose of Crestor 10mg .  Can continued this same dose.  Here for recheck.  No new side effects.  Starting to exercise recently.  No new muscle aches.  Back is sore at times - but with restarting exercise, and has had some soreness in past with exercise.    Labs form September 2013:  Results for orders placed in visit on 10/21/11  CBC WITH DIFFERENTIAL      Result Value Range   WBC 10.1  4.0 - 10.5 K/uL   RBC 4.85  4.22 - 5.81 MIL/uL   Hemoglobin 15.7  13.0 - 17.0 g/dL   HCT 40.9  81.1 - 91.4 %   MCV 91.1  78.0 - 100.0 fL   MCH 32.4  26.0 - 34.0 pg   MCHC 35.5  30.0 - 36.0 g/dL   RDW 78.2  95.6 - 21.3 %   Platelets 270  150 - 400 K/uL   Neutrophils Relative 53  43 - 77 %   Neutro Abs 5.4  1.7 - 7.7 K/uL   Lymphocytes Relative 35  12 - 46 %   Lymphs Abs 3.5  0.7 - 4.0 K/uL   Monocytes Relative 9  3 - 12 %   Monocytes Absolute 0.9  0.1 - 1.0 K/uL   Eosinophils Relative 3  0 - 5 %   Eosinophils Absolute 0.3  0.0 - 0.7 K/uL   Basophils Relative 0  0 - 1 %   Basophils Absolute 0.0  0.0 - 0.1 K/uL   Smear Review Criteria for review not met    COMPREHENSIVE METABOLIC PANEL   Result Value Range   Sodium 138  135 - 145 mEq/L   Potassium 3.9  3.5 - 5.3 mEq/L   Chloride 100  96 - 112 mEq/L   CO2 27  19 - 32 mEq/L   Glucose, Bld 79  70 - 99 mg/dL   BUN 12  6 - 23 mg/dL   Creat 0.86  5.78 - 4.69 mg/dL   Total Bilirubin 1.1  0.3 - 1.2 mg/dL   Alkaline Phosphatase 80  39 - 117 U/L   AST 24  0 - 37 U/L   ALT 28  0 - 53 U/L   Total Protein 6.7  6.0 - 8.3 g/dL   Albumin 4.5  3.5 - 5.2 g/dL   Calcium 9.7  8.4 - 62.9 mg/dL  LIPID PANEL      Result Value Range   Cholesterol 150  0 - 200 mg/dL   Triglycerides 528 (*) <150 mg/dL   HDL 32 (*) >41 mg/dL  Total CHOL/HDL Ratio 4.7     VLDL 41 (*) 0 - 40 mg/dL   LDL Cholesterol 77  0 - 99 mg/dL  PSA      Result Value Range   PSA 0.79  <=4.00 ng/mL  TSH      Result Value Range   TSH 1.174  0.350 - 4.500 uIU/mL      Review of Systems  Constitutional: Negative for fatigue and unexpected weight change.  Eyes: Negative for visual disturbance.  Respiratory: Negative for cough, chest tightness and shortness of breath.   Cardiovascular: Negative for chest pain, palpitations and leg swelling.  Gastrointestinal: Negative for abdominal pain and blood in stool.  Neurological: Negative for dizziness, light-headedness and headaches.  Psychiatric/Behavioral: Negative for sleep disturbance.       Objective:   Physical Exam  Vitals reviewed. Constitutional: He is oriented to person, place, and time. He appears well-developed and well-nourished.  HENT:  Head: Normocephalic and atraumatic.  Eyes: EOM are normal. Pupils are equal, round, and reactive to light.  Neck: No JVD present. Carotid bruit is not present.  Cardiovascular: Normal rate, regular rhythm and normal heart sounds.   No murmur heard. Pulmonary/Chest: Effort normal and breath sounds normal. He has no wheezes. He has no rales.  Musculoskeletal: He exhibits no edema.  Neurological: He is alert and oriented to person, place, and time.  Skin: Skin is warm and  dry.  Psychiatric: He has a normal mood and affect. His behavior is normal. Judgment and thought content normal.       Assessment & Plan:  [name] is a 52 y.o. male Hyperlipidemia - Plan: rosuvastatin (CRESTOR) 10 MG tablet, Lipid panel, Comprehensive metabolic panel pending. Continue crestor 1/2 of 10mg  qd for now.   Insomnia - Plan: controlled. Can continue otc melatonin, but if snoring or daytime somnolence, would recommend sleep study as discussed prior.   HTN (hypertension) - stable.  Continue hctz 12.5mg  qd.   GERD (gastroesophageal reflux disease) - with this being likely cause pf prior cough (resolving URI at present). Improved reflux sx's on daily zantac.  Continue this and trigger avoidance.  rtc precautions discussed for cough.  Patient Instructions  Your should receive a call or letter about your lab results within the next week to 10 days.  Continue your same medicines, including the zantac.  If the cough is not improving, or back pain worsens - recheck in office.  Return to the clinic or go to the nearest emergency room if any of your symptoms worsen or new symptoms occur.     Meds ordered this encounter  Medications  . fish oil-omega-3 fatty acids 1000 MG capsule    Sig: Take 2 g by mouth daily.  . rosuvastatin (CRESTOR) 10 MG tablet    Sig: Take 0.5 tablets (5 mg total) by mouth daily.    Dispense:  90 tablet    Refill:  1  . hydrochlorothiazide (MICROZIDE) 12.5 MG capsule    Sig: Take 1 capsule (12.5 mg total) by mouth daily.    Dispense:  90 capsule    Refill:  1

## 2012-04-03 LAB — COMPREHENSIVE METABOLIC PANEL
BUN: 14 mg/dL (ref 6–23)
CO2: 27 mEq/L (ref 19–32)
Creat: 1.02 mg/dL (ref 0.50–1.35)
Glucose, Bld: 91 mg/dL (ref 70–99)
Total Bilirubin: 1.4 mg/dL — ABNORMAL HIGH (ref 0.3–1.2)
Total Protein: 6.7 g/dL (ref 6.0–8.3)

## 2012-04-03 LAB — LIPID PANEL
Cholesterol: 173 mg/dL (ref 0–200)
HDL: 36 mg/dL — ABNORMAL LOW (ref >39)
Total CHOL/HDL Ratio: 4.8 Ratio
Triglycerides: 120 mg/dL (ref <150)

## 2012-04-20 ENCOUNTER — Other Ambulatory Visit: Payer: Self-pay | Admitting: Family Medicine

## 2012-10-08 ENCOUNTER — Ambulatory Visit (INDEPENDENT_AMBULATORY_CARE_PROVIDER_SITE_OTHER): Payer: Commercial Managed Care - PPO | Admitting: Family Medicine

## 2012-10-08 ENCOUNTER — Encounter: Payer: Self-pay | Admitting: Family Medicine

## 2012-10-08 VITALS — BP 158/88 | HR 61 | Temp 98.1°F | Resp 16 | Ht 63.5 in | Wt 140.0 lb

## 2012-10-08 DIAGNOSIS — M25519 Pain in unspecified shoulder: Secondary | ICD-10-CM

## 2012-10-08 DIAGNOSIS — M25559 Pain in unspecified hip: Secondary | ICD-10-CM

## 2012-10-08 DIAGNOSIS — E785 Hyperlipidemia, unspecified: Secondary | ICD-10-CM

## 2012-10-08 DIAGNOSIS — L909 Atrophic disorder of skin, unspecified: Secondary | ICD-10-CM

## 2012-10-08 DIAGNOSIS — M25511 Pain in right shoulder: Secondary | ICD-10-CM

## 2012-10-08 DIAGNOSIS — M25551 Pain in right hip: Secondary | ICD-10-CM

## 2012-10-08 DIAGNOSIS — I10 Essential (primary) hypertension: Secondary | ICD-10-CM

## 2012-10-08 DIAGNOSIS — Z0000 Encounter for general adult medical examination without abnormal findings: Secondary | ICD-10-CM

## 2012-10-08 DIAGNOSIS — K219 Gastro-esophageal reflux disease without esophagitis: Secondary | ICD-10-CM

## 2012-10-08 DIAGNOSIS — G4700 Insomnia, unspecified: Secondary | ICD-10-CM

## 2012-10-08 LAB — COMPREHENSIVE METABOLIC PANEL
ALT: 31 U/L (ref 0–53)
AST: 23 U/L (ref 0–37)
Albumin: 4.3 g/dL (ref 3.5–5.2)
Calcium: 9.2 mg/dL (ref 8.4–10.5)
Chloride: 105 mEq/L (ref 96–112)
Potassium: 3.8 mEq/L (ref 3.5–5.3)

## 2012-10-08 LAB — POCT URINALYSIS DIPSTICK
Bilirubin, UA: NEGATIVE
Glucose, UA: NEGATIVE
Leukocytes, UA: NEGATIVE
Nitrite, UA: NEGATIVE

## 2012-10-08 LAB — CBC
Hemoglobin: 15.7 g/dL (ref 13.0–17.0)
RBC: 4.82 MIL/uL (ref 4.22–5.81)

## 2012-10-08 LAB — IFOBT (OCCULT BLOOD): IFOBT: NEGATIVE

## 2012-10-08 LAB — LIPID PANEL: Total CHOL/HDL Ratio: 4.5 Ratio

## 2012-10-08 MED ORDER — ROSUVASTATIN CALCIUM 10 MG PO TABS: 5.0000 mg | ORAL_TABLET | Freq: Every day | ORAL | Status: DC

## 2012-10-08 MED ORDER — RANITIDINE HCL 75 MG PO TABS: 75.0000 mg | ORAL_TABLET | Freq: Every day | ORAL | Status: AC

## 2012-10-08 MED ORDER — HYDROCHLOROTHIAZIDE 25 MG PO TABS: 25.0000 mg | ORAL_TABLET | Freq: Every day | ORAL | Status: DC

## 2012-10-08 NOTE — Patient Instructions (Addendum)
Shoulder exercises and tylenol if needed.  You can also take tylenol for your hip pain, but follow up in the next 3 weeks to discuss this further with possible xrays  If the swelling on your chest wall returns - return to discuss further.  To look up more info on your condition, go to the website [email or web address], then on patient resources - select [facility]ATE. Under patient resources, select Hypertension. Increase to 25mg  each day of hydrochlorothiazide, Keep a record of your blood pressures outside of the office and bring them to the next office visit.  If you feel this medicine is too strong, you can split the dose in half and recheck in few weeks.   You should receive a call or letter about your lab results within the next week to 10 days.   Return to the clinic or go to the nearest emergency room if any of your symptoms worsen or new symptoms occur.   Keeping you healthy  Get these tests  Blood pressure- Have your blood pressure checked once a year by your healthcare provider.  Normal blood pressure is 120/80  Weight- Have your body mass index (BMI) calculated to screen for obesity.  BMI is a measure of body fat based on height and weight. You can also calculate your own BMI at [email or web address].  Cholesterol- Have your cholesterol checked every year.  Diabetes- Have your blood sugar checked regularly if you have high blood pressure, high cholesterol, have a family history of diabetes or if you are overweight.  Screening for Colon Cancer- Colonoscopy starting at age 25.  Screening may begin sooner depending on your family history and other health conditions. Follow up colonoscopy as directed by your Gastroenterologist.  Screening for Prostate Cancer- Both blood work (PSA) and a rectal exam help screen for Prostate Cancer.  Screening begins at age 76 with African-American men and at age 38 with Caucasian men.  Screening may begin sooner depending on your family history.  Take these  medicines  Aspirin- One aspirin daily can help prevent Heart disease and Stroke.  Flu shot- Every fall.  Tetanus- Every 10 years.  Zostavax- Once after the age of 44 to prevent Shingles.  Pneumonia shot- Once after the age of 56; if you are younger than 65, ask your healthcare provider if you need a Pneumonia shot.  Take these steps  Don't smoke- If you do smoke, talk to your doctor about quitting.  For tips on how to quit, go to www.smokefree.gov or call 1-800-QUIT-NOW.  Be physically active- Exercise 5 days a week for at least 30 minutes.  If you are not already physically active start slow and gradually work up to 30 minutes of moderate physical activity.  Examples of moderate activity include walking briskly, mowing the yard, dancing, swimming, bicycling, etc.  Eat a healthy diet- Eat a variety of healthy food such as fruits, vegetables, low fat milk, low fat cheese, yogurt, lean meant, poultry, fish, beans, tofu, etc. For more information go to www.thenutritionsource.org  Drink alcohol in moderation- Limit alcohol intake to less than two drinks a day. Never drink and drive.  Dentist- Brush and floss twice daily; visit your dentist twice a year.  Depression- Your emotional health is as important as your physical health. If you're feeling down, or losing interest in things you would normally enjoy please talk to your healthcare provider.  Eye exam- Visit your eye doctor every year.  Safe sex- If you may be exposed to a sexually transmitted infection,  use a condom.  Seat belts- Seat belts can save your life; always wear one.  Smoke/Carbon Monoxide detectors- These detectors need to be installed on the appropriate level of your home.  Replace batteries at least once a year.  Skin cancer- When out in the sun, cover up and use sunscreen 15 SPF or higher.  Violence- If anyone is threatening you, please tell your healthcare provider.  Living Will/ Health care power of attorney-  Speak with your healthcare provider and family.

## 2012-10-08 NOTE — Progress Notes (Signed)
  Subjective:    Patient ID: [name], male    DOB: December 13, 1960, 52 y.o.   MRN: [id number]  HPI    Review of Systems  Constitutional: Positive for activity change.  HENT: Negative.   Eyes: Negative.   Respiratory: Negative.   Cardiovascular: Negative.   Gastrointestinal: Positive for abdominal pain.  Endocrine: Negative.   Genitourinary: Negative.   Musculoskeletal: Positive for myalgias and back pain.  Skin: Negative.   Allergic/Immunologic: Negative.   Neurological: Negative.   Hematological: Negative.   Psychiatric/Behavioral: Negative.        Objective:   Physical Exam        Assessment & Plan:

## 2012-10-08 NOTE — Progress Notes (Signed)
Subjective:    Patient ID: [name], male    DOB: [birth year], 52 y.o.   MRN: [id number]  HPI [name] is a 52 y.o. male Hx of HTn, hyperlipidemia. Here for CPE and few other concerns. Last ov 03/2012.   CPE: Flu vaccine: not yet. Refused today.  Dentist: scheduled this wednesday Optho: 1 month ago.  PSA: 0.79 in 09/2011. NO known FH of prostate cancer. Discussed testing again - agreed to have. Colonoscopy:10/2010 - repeat in 5 years.   HTN - home BP's - has not been checking much, 140's over 90 or so. Weight 137 to 140 since last ov. Less exercise. Walks at works.   Hyperlipidemia - LDL 113 on 03/2012 labs.  Fasting labs drawn this am. Crestor- 1/2 of Crestor 10mg  QD.    Insomnia - uses melatonin to sleep. Works ok.   Shoulder pain - R shoulder, past 6 months - noticed in both after riding bikes again, but NKI. R more sore. No otc treatments. R hand dominant.   R Hip pain - off an on past 6 months after a bike ride. Feels deeper in joint. No locking or giving way.  No attempted treatments.   Lump on chest - on L chest about a month ago.  - lasted for about 10 days.  Enlarged then resolved. No outward drainage. Back to normal now.  Plans to RTC if recurs. No testicle lumps/swelling. No nipple discharge.   GERD - takes Zantac - well controlled.    Results for orders placed in visit on 04/02/12  LIPID PANEL      Result Value Range   Cholesterol 173  0 - 200 mg/dL   Triglycerides 409  <811 mg/dL   HDL 36 (*) >91 mg/dL   Total CHOL/HDL Ratio 4.8     VLDL 24  0 - 40 mg/dL   LDL Cholesterol 478 (*) 0 - 99 mg/dL  COMPREHENSIVE METABOLIC PANEL      Result Value Range   Sodium 139  135 - 145 mEq/L   Potassium 3.8  3.5 - 5.3 mEq/L   Chloride 103  96 - 112 mEq/L   CO2 27  19 - 32 mEq/L   Glucose, Bld 91  70 - 99 mg/dL   BUN 14  6 - 23 mg/dL   Creat 2.95  6.21 - 3.08 mg/dL   Total Bilirubin 1.4 (*) 0.3 - 1.2 mg/dL   Alkaline Phosphatase 77  39 - 117 U/L   AST  22  0 - 37 U/L   ALT 30  0 - 53 U/L   Total Protein 6.7  6.0 - 8.3 g/dL   Albumin 4.2  3.5 - 5.2 g/dL   Calcium 9.7  8.4 - 65.7 mg/dL     Review of Systems  Constitutional: Negative for fatigue and unexpected weight change.  Eyes: Negative for visual disturbance.  Respiratory: Negative for cough, chest tightness and shortness of breath.   Cardiovascular: Negative for chest pain, palpitations and leg swelling.  Gastrointestinal: Negative for abdominal pain and blood in stool.  Neurological: Positive for headaches (occasional.). Negative for dizziness and light-headedness.   13 point review of systems per patient health survey noted.  Negative other than as indicated on reviewed nursing note.  Noted abdominal pain on form after visit - called to clarify this - left message on VM Will try to reach him again to discuss this and timing to determine if needed to return prior to scheduled as below.  Objective:   Physical Exam  Vitals reviewed. Constitutional: He is oriented to person, place, and time. He appears well-developed and well-nourished.  HENT:  Head: Normocephalic and atraumatic.  Right Ear: External ear normal.  Left Ear: External ear normal.  Mouth/Throat: Oropharynx is clear and moist.  Eyes: Conjunctivae and EOM are normal. Pupils are equal, round, and reactive to light.  Neck: Normal range of motion. Neck supple. No thyromegaly present.  Cardiovascular: Normal rate, regular rhythm, normal heart sounds and intact distal pulses.   Pulmonary/Chest: Effort normal and breath sounds normal. No respiratory distress. He has no wheezes. He exhibits no mass, no edema and no swelling. Right breast exhibits no mass, no nipple discharge, no skin change and no tenderness. Left breast exhibits no mass, no nipple discharge, no skin change and no tenderness.  Abdominal: Soft. He exhibits no distension. There is no tenderness. Hernia confirmed negative in the right inguinal area and  confirmed negative in the left inguinal area.  Genitourinary: Prostate normal and penis normal. Right testis shows no mass, no swelling and no tenderness. Left testis shows no mass, no swelling and no tenderness.  Musculoskeletal: Normal range of motion. He exhibits no edema.       Right shoulder: He exhibits pain (positive neer, hawkins, and mildly positive crossover, but no [place]/AC ttp). He exhibits normal range of motion, no bony tenderness, no swelling and normal strength.       Right hip: He exhibits normal range of motion, normal strength, no tenderness, no bony tenderness (no focal palpable ttp. slight discomfort at terminal internal rotation. ) and no deformity.  Lymphadenopathy:    He has no cervical adenopathy.  Neurological: He is alert and oriented to person, place, and time. He has normal reflexes.  Skin: Skin is warm and dry.  Psychiatric: He has a normal mood and affect. His behavior is normal.    Filed Vitals:   10/08/12 1513  BP: 158/88  Pulse: 61  Temp: 98.1 F (36.7 C)  Resp: 16   Results for orders placed in visit on 10/08/12  IFOBT (OCCULT BLOOD)      Result Value Range   IFOBT Negative    POCT URINALYSIS DIPSTICK      Result Value Range   Color, UA yellow     Clarity, UA clear     Glucose, UA neg     Bilirubin, UA neg     Ketones, UA neg     Spec Grav, UA >=1.030     Blood, UA neg     pH, UA 6.0     Protein, UA neg     Urobilinogen, UA 0.2     Nitrite, UA neg     Leukocytes, UA Negative          Assessment & Plan:  [name] is a 52 y.o. male  Annual physical exam - Plan: IFOBT POC (occult bld, rslt in office), POCT urinalysis dipstick, CBC, TSH, PSA, Comprehensive metabolic panel, Lipid panel Refused flu vaccine.  utd on colonoscopy, heme negative on testing today - repeat colonoscopy in 2017 psa pending - this testing and options were discussed as above.  Labs pending above, anticipatory guidance given per AVS.   Pain in joint,  shoulder region, right - approx 6 months, without known injury - suspect component of impingement/subacromial bursitis, and would usually try antiinflammatory, but HTN not at control at this point. HEP by sports medicine handout with theraband, tylenol otc prn, and recheck for probable  xrays +/- subacromial injection in a few weeks.   Right hip pain - intermittent, NKI. DJD possible, but plans to rtc to discuss in more detail and possible xrays. Ok to try Tylenol for this as well.   Hx of swelling/enlarged area under L breast - resolved now,  No known FH of breast CA, but if any return of sx's - rtc for eval +/- ultrasound of area.   HTN (hypertension) - Plan: hydrochlorothiazide (HYDRODIURIL) 25 MG tablet, Comprehensive metabolic panel - decreased control - even with home readings. Can try increase of HCTZ to 25mg  qd, and if intolerant of this dose, split in half to go back to 12.5mg  QD, and work on exercise, diet.   Other and unspecified hyperlipidemia - Plan: Lipid panel pending.  Doubt arthralgias of shoulder and hip due to small dose of Crestor, but can consider trial off this if unrelieved with above - to be discussed further at follow up. Refilled Crestor for now - 1/2 of 10mg  qd.   Insomnia - otc melatonin, or rare motrin pm ok. Improved prior with GERD tx, but consider sleep study if persists - see prior notes.   GERD (gastroesophageal reflux disease) - Plan: ranitidine (ZANTAC) 75 MG tablet refilled. Stable.   Patient Instructions  Shoulder exercises and tylenol if needed.  You can also take tylenol for your hip pain, but follow up in the next 3 weeks to discuss this further with possible xrays  If the swelling on your chest wall returns - return to discuss further.  To look up more info on your condition, go to the website [email or web address], then on patient resources - select [facility]. Under patient resources, select Hypertension. Increase to 25mg  each day of hydrochlorothiazide, Keep a  record of your blood pressures outside of the office and bring them to the next office visit.  If you feel this medicine is too strong, you can split the dose in half and recheck in few weeks.   You should receive a call or letter about your lab results within the next week to 10 days.   Return to the clinic or go to the nearest emergency room if any of your symptoms worsen or new symptoms occur.   Keeping you healthy  Get these tests  Blood pressure- Have your blood pressure checked once a year by your healthcare provider.  Normal blood pressure is 120/80  Weight- Have your body mass index (BMI) calculated to screen for obesity.  BMI is a measure of body fat based on height and weight. You can also calculate your own BMI at [email or web address].  Cholesterol- Have your cholesterol checked every year.  Diabetes- Have your blood sugar checked regularly if you have high blood pressure, high cholesterol, have a family history of diabetes or if you are overweight.  Screening for Colon Cancer- Colonoscopy starting at age 41.  Screening may begin sooner depending on your family history and other health conditions. Follow up colonoscopy as directed by your Gastroenterologist.  Screening for Prostate Cancer- Both blood work (PSA) and a rectal exam help screen for Prostate Cancer.  Screening begins at age 36 with African-American men and at age 36 with Caucasian men.  Screening may begin sooner depending on your family history.  Take these medicines  Aspirin- One aspirin daily can help prevent Heart disease and Stroke.  Flu shot- Every fall.  Tetanus- Every 10 years.  Zostavax- Once after the age of 62 to prevent Shingles.  Pneumonia shot- Once after the age  of 44; if you are younger than 67, ask your healthcare provider if you need a Pneumonia shot.  Take these steps  Don't smoke- If you do smoke, talk to your doctor about quitting.  For tips on how to quit, go to www.smokefree.gov  or call 1-800-QUIT-NOW.  Be physically active- Exercise 5 days a week for at least 30 minutes.  If you are not already physically active start slow and gradually work up to 30 minutes of moderate physical activity.  Examples of moderate activity include walking briskly, mowing the yard, dancing, swimming, bicycling, etc.  Eat a healthy diet- Eat a variety of healthy food such as fruits, vegetables, low fat milk, low fat cheese, yogurt, lean meant, poultry, fish, beans, tofu, etc. For more information go to www.thenutritionsource.org  Drink alcohol in moderation- Limit alcohol intake to less than two drinks a day. Never drink and drive.  Dentist- Brush and floss twice daily; visit your dentist twice a year.  Depression- Your emotional health is as important as your physical health. If you're feeling down, or losing interest in things you would normally enjoy please talk to your healthcare provider.  Eye exam- Visit your eye doctor every year.  Safe sex- If you may be exposed to a sexually transmitted infection, use a condom.  Seat belts- Seat belts can save your life; always wear one.  Smoke/Carbon Monoxide detectors- These detectors need to be installed on the appropriate level of your home.  Replace batteries at least once a year.  Skin cancer- When out in the sun, cover up and use sunscreen 15 SPF or higher.  Violence- If anyone is threatening you, please tell your healthcare provider.  Living Will/ Health care power of attorney- Speak with your healthcare provider and family.

## 2012-10-09 LAB — PSA: PSA: 1.12 ng/mL (ref <=4.00)

## 2012-10-09 LAB — TSH: TSH: 1.493 u[IU]/mL (ref 0.350–4.500)

## 2012-10-14 ENCOUNTER — Encounter: Payer: Self-pay | Admitting: Family Medicine

## 2012-11-05 ENCOUNTER — Ambulatory Visit: Payer: Commercial Managed Care - PPO | Admitting: Family Medicine

## 2012-11-05 ENCOUNTER — Encounter: Payer: Self-pay | Admitting: Family Medicine

## 2012-11-05 VITALS — BP 132/86 | HR 85 | Temp 98.6°F | Resp 20 | Ht 64.5 in | Wt 138.4 lb

## 2012-11-05 DIAGNOSIS — I10 Essential (primary) hypertension: Secondary | ICD-10-CM

## 2012-11-05 MED ORDER — LISINOPRIL 5 MG PO TABS: 5.0000 mg | ORAL_TABLET | Freq: Every day | ORAL | Status: DC

## 2012-11-05 NOTE — Progress Notes (Signed)
Subjective:    Patient ID: [name], male    DOB: [birth year], 52 y.o.   MRN: [id number]  HPI  [name] is a 52 y.o. male  See 10/08/12 ov.  Increased HCTZ to 25mg  qd for borderline high readings prior. On higher dose past 2 weeks. Home BP's - 135/high 80's, then 155/100 on machine today. Up to 150/100 at times past 2 weeks. Lowest 130/79.  Feels agitation when BP up. feels like running higher. Feels like the capsules worked better. On tablet now. No HA, dizziness.lightheadedness. Urinating normally, no CP, no weakness. Feels like can hear heartbeat when BP up.   Results for orders placed in visit on 10/08/12  CBC      Result Value Range   WBC 10.3  4.0 - 10.5 K/uL   RBC 4.82  4.22 - 5.81 MIL/uL   Hemoglobin 15.7  13.0 - 17.0 g/dL   HCT 40.9  81.1 - 91.4 %   MCV 91.5  78.0 - 100.0 fL   MCH 32.6  26.0 - 34.0 pg   MCHC 35.6  30.0 - 36.0 g/dL   RDW 78.2  95.6 - 21.3 %   Platelets 247  150 - 400 K/uL  TSH      Result Value Range   TSH 1.493  0.350 - 4.500 uIU/mL  PSA      Result Value Range   PSA 1.12  <=4.00 ng/mL  COMPREHENSIVE METABOLIC PANEL      Result Value Range   Sodium 139  135 - 145 mEq/L   Potassium 3.8  3.5 - 5.3 mEq/L   Chloride 105  96 - 112 mEq/L   CO2 28  19 - 32 mEq/L   Glucose, Bld 86  70 - 99 mg/dL   BUN 13  6 - 23 mg/dL   Creat 0.86  5.78 - 4.69 mg/dL   Total Bilirubin 0.9  0.3 - 1.2 mg/dL   Alkaline Phosphatase 76  39 - 117 U/L   AST 23  0 - 37 U/L   ALT 31  0 - 53 U/L   Total Protein 6.8  6.0 - 8.3 g/dL   Albumin 4.3  3.5 - 5.2 g/dL   Calcium 9.2  8.4 - 62.9 mg/dL  LIPID PANEL      Result Value Range   Cholesterol 158  0 - 200 mg/dL   Triglycerides 528 (*) <150 mg/dL   HDL 35 (*) >41 mg/dL   Total CHOL/HDL Ratio 4.5     VLDL 31  0 - 40 mg/dL   LDL Cholesterol 92  0 - 99 mg/dL  IFOBT (OCCULT BLOOD)      Result Value Range   IFOBT Negative    POCT URINALYSIS DIPSTICK      Result Value Range   Color, UA yellow     Clarity,  UA clear     Glucose, UA neg     Bilirubin, UA neg     Ketones, UA neg     Spec Grav, UA >=1.030     Blood, UA neg     pH, UA 6.0     Protein, UA neg     Urobilinogen, UA 0.2     Nitrite, UA neg     Leukocytes, UA Negative        Review of Systems  Constitutional: Negative for fatigue and unexpected weight change.  Eyes: Negative for visual disturbance.  Respiratory: Negative for cough, chest tightness and shortness of breath.  Cardiovascular: Negative for chest pain, palpitations and leg swelling.  Gastrointestinal: Negative for abdominal pain and blood in stool.  Neurological: Negative for dizziness, light-headedness and headaches.  Psychiatric/Behavioral: Positive for agitation (when elevated BP. ).       Objective:   Physical Exam  Vitals reviewed. Constitutional: He is oriented to person, place, and time. He appears well-developed and well-nourished.  HENT:  Head: Normocephalic and atraumatic.  Eyes: EOM are normal. Pupils are equal, round, and reactive to light.  Neck: No JVD present. Carotid bruit is not present.  Cardiovascular: Normal rate, regular rhythm and normal heart sounds.   No murmur heard. Pulmonary/Chest: Effort normal and breath sounds normal. He has no rales.  Musculoskeletal: He exhibits no edema.  Neurological: He is alert and oriented to person, place, and time.  Skin: Skin is warm and dry.  Psychiatric: He has a normal mood and affect.       Assessment & Plan:  [name] is a 52 y.o. male HTN (hypertension) - Plan: lisinopril (PRINIVIL,ZESTRIL) 5 MG tablet  borderline control here, elevated home numbers persisting on higher dose of HCTZ. Continue 25mg  QD of hctz, add lisinopril 5mg , orthostatic precautions given,  and plan on creatinine at follow up in next month.  Meds ordered this encounter  Medications  . lisinopril (PRINIVIL,ZESTRIL) 5 MG tablet    Sig: Take 1 tablet (5 mg total) by mouth daily.    Dispense:  30 tablet     Refill:  1   Patient Instructions  Start new med in addition to the hctz - one per day.  Stop this medicine If lightheaded, dizzy, or low blodd pressures(Keep a record of your blood pressures outside of the office and bring them to the next office visit).  Return to the clinic or go to the nearest emergency room if any of your symptoms worsen or new symptoms occur.

## 2012-11-05 NOTE — Patient Instructions (Signed)
Start new med in addition to the hctz - one per day.  Stop this medicine If lightheaded, dizzy, or low blodd pressures(Keep a record of your blood pressures outside of the office and bring them to the next office visit).  Return to the clinic or go to the nearest emergency room if any of your symptoms worsen or new symptoms occur.

## 2012-11-29 ENCOUNTER — Other Ambulatory Visit: Payer: Self-pay

## 2012-12-03 ENCOUNTER — Ambulatory Visit: Payer: Commercial Managed Care - PPO | Admitting: Family Medicine

## 2012-12-03 ENCOUNTER — Encounter: Payer: Self-pay | Admitting: Family Medicine

## 2012-12-03 VITALS — BP 118/70 | HR 77 | Temp 97.8°F | Resp 16 | Ht 64.0 in | Wt 139.0 lb

## 2012-12-03 DIAGNOSIS — I10 Essential (primary) hypertension: Secondary | ICD-10-CM

## 2012-12-03 LAB — BASIC METABOLIC PANEL
BUN: 12 mg/dL (ref 6–23)
CO2: 28 mEq/L (ref 19–32)
Calcium: 9.1 mg/dL (ref 8.4–10.5)
Chloride: 101 mEq/L (ref 96–112)
Glucose, Bld: 94 mg/dL (ref 70–99)
Potassium: 3.8 mEq/L (ref 3.5–5.3)
Sodium: 139 mEq/L (ref 135–145)

## 2012-12-03 MED ORDER — LISINOPRIL 5 MG PO TABS: 5.0000 mg | ORAL_TABLET | Freq: Every day | ORAL | Status: DC

## 2012-12-03 NOTE — Progress Notes (Signed)
Subjective:    Patient ID: [name], male    DOB: [birth year], 52 y.o.   MRN: [id number]  HPI [name] is a 52 y.o. male Here for follow up HTN - added lisinopril 5mg  QD to HCTZ at last ov in 10/14 due to borderline control here, elevated home numbers persisting on higher dose of HCTZ. Creat 1.00 in 09/2012.   Home readings 110-120/60-80. Occasional tickle in throat if dehydrated only or sinus congestion. No new cough otherwise.    Results for orders placed in visit on 10/08/12  CBC      Result Value Range   WBC 10.3  4.0 - 10.5 K/uL   RBC 4.82  4.22 - 5.81 MIL/uL   Hemoglobin 15.7  13.0 - 17.0 g/dL   HCT 40.9  81.1 - 91.4 %   MCV 91.5  78.0 - 100.0 fL   MCH 32.6  26.0 - 34.0 pg   MCHC 35.6  30.0 - 36.0 g/dL   RDW 78.2  95.6 - 21.3 %   Platelets 247  150 - 400 K/uL  TSH      Result Value Range   TSH 1.493  0.350 - 4.500 uIU/mL  PSA      Result Value Range   PSA 1.12  <=4.00 ng/mL  COMPREHENSIVE METABOLIC PANEL      Result Value Range   Sodium 139  135 - 145 mEq/L   Potassium 3.8  3.5 - 5.3 mEq/L   Chloride 105  96 - 112 mEq/L   CO2 28  19 - 32 mEq/L   Glucose, Bld 86  70 - 99 mg/dL   BUN 13  6 - 23 mg/dL   Creat 0.86  5.78 - 4.69 mg/dL   Total Bilirubin 0.9  0.3 - 1.2 mg/dL   Alkaline Phosphatase 76  39 - 117 U/L   AST 23  0 - 37 U/L   ALT 31  0 - 53 U/L   Total Protein 6.8  6.0 - 8.3 g/dL   Albumin 4.3  3.5 - 5.2 g/dL   Calcium 9.2  8.4 - 62.9 mg/dL  LIPID PANEL      Result Value Range   Cholesterol 158  0 - 200 mg/dL   Triglycerides 528 (*) <150 mg/dL   HDL 35 (*) >41 mg/dL   Total CHOL/HDL Ratio 4.5     VLDL 31  0 - 40 mg/dL   LDL Cholesterol 92  0 - 99 mg/dL  IFOBT (OCCULT BLOOD)      Result Value Range   IFOBT Negative    POCT URINALYSIS DIPSTICK      Result Value Range   Color, UA yellow     Clarity, UA clear     Glucose, UA neg     Bilirubin, UA neg     Ketones, UA neg     Spec Grav, UA >=1.030     Blood, UA neg     pH, UA  6.0     Protein, UA neg     Urobilinogen, UA 0.2     Nitrite, UA neg     Leukocytes, UA Negative      Patient Active Problem List   Diagnosis Date Noted  . Hyperlipidemia 05/27/2011  . HTN (hypertension) 05/27/2011   Past Medical History  Diagnosis Date  . Hyperlipidemia   . Hypertension    No past surgical history on file. No Known Allergies Prior to Admission medications   Medication Sig Start  Date End Date Taking? Authorizing Provider  aspirin 81 MG tablet Take 81 mg by mouth daily.   Yes Historical Provider, MD  fish oil-omega-3 fatty acids 1000 MG capsule Take 2 g by mouth daily.   Yes Historical Provider, MD  hydrochlorothiazide (HYDRODIURIL) 25 MG tablet Take 1 tablet (25 mg total) by mouth daily. 10/08/12  Yes Shade Flood, MD  lisinopril (PRINIVIL,ZESTRIL) 5 MG tablet Take 1 tablet (5 mg total) by mouth daily. 11/05/12  Yes Shade Flood, MD  Multiple Vitamin (MULTIVITAMIN) tablet Take 1 tablet by mouth daily.   Yes Historical Provider, MD  ranitidine (ZANTAC) 75 MG tablet Take 1 tablet (75 mg total) by mouth daily. 10/08/12  Yes Shade Flood, MD  rosuvastatin (CRESTOR) 10 MG tablet Take 0.5 tablets (5 mg total) by mouth daily. 10/08/12  Yes Shade Flood, MD   History   Social History  . Marital Status: Single    Spouse Name: N/A    Number of Children: N/A  . Years of Education: N/A   Occupational History  . Not on file.   Social History Main Topics  . Smoking status: Current Every Day Smoker  . Smokeless tobacco: Not on file  . Alcohol Use: No  . Drug Use: No  . Sexual Activity: Not on file   Other Topics Concern  . Not on file   Social History Narrative  . No narrative on file       Review of Systems  Constitutional: Negative for fatigue and unexpected weight change.  Eyes: Negative for visual disturbance.  Respiratory: Negative for cough, chest tightness and shortness of breath.   Cardiovascular: Negative for chest pain,  palpitations and leg swelling.  Gastrointestinal: Negative for abdominal pain and blood in stool.  Neurological: Negative for dizziness, light-headedness and headaches.       Objective:   Physical Exam  Vitals reviewed. Constitutional: He is oriented to person, place, and time. He appears well-developed and well-nourished.  HENT:  Head: Normocephalic and atraumatic.  Eyes: EOM are normal. Pupils are equal, round, and reactive to light.  Neck: No JVD present. Carotid bruit is not present.  Cardiovascular: Normal rate, regular rhythm and normal heart sounds.   No murmur heard. Pulmonary/Chest: Effort normal and breath sounds normal. He has no rales.  Musculoskeletal: He exhibits no edema.  Neurological: He is alert and oriented to person, place, and time.  Skin: Skin is warm and dry.  Psychiatric: He has a normal mood and affect. His behavior is normal.   Filed Vitals:   12/03/12 1526  BP: 118/70  Pulse: 77  Temp: 97.8 F (36.6 C)  Resp: 16  Height: 5\' 4"  (1.626 m)  Weight: 139 lb (63.05 kg)       Assessment & Plan:   Damaria Stofko is a 52 y.o. male HTN (hypertension) - Plan: Basic metabolic panel, lisinopril (PRINIVIL,ZESTRIL) 5 MG tablet Now controlled. Continue same dose of lisinopril at 5mg  qd, cont hctz 25mg  qd. Check BMP, repeat ov in 6 months.   Meds ordered this encounter  Medications  . lisinopril (PRINIVIL,ZESTRIL) 5 MG tablet    Sig: Take 1 tablet (5 mg total) by mouth daily.    Dispense:  90 tablet    Refill:  1   Patient Instructions  Keep a record of your blood pressures outside of the office and bring them to the next office visit. Follow up in 6 months.  Let me know if you need medication refills  prior to that time. You should receive a call or letter about your lab results within the next week to 10 days.

## 2012-12-03 NOTE — Patient Instructions (Signed)
Keep a record of your blood pressures outside of the office and bring them to the next office visit. Follow up in 6 months.  Let me know if you need medication refills prior to that time. You should receive a call or letter about your lab results within the next week to 10 days.

## 2012-12-08 ENCOUNTER — Encounter: Payer: Self-pay | Admitting: Family Medicine

## 2013-05-27 ENCOUNTER — Encounter: Payer: Self-pay | Admitting: Family Medicine

## 2013-05-27 ENCOUNTER — Ambulatory Visit (INDEPENDENT_AMBULATORY_CARE_PROVIDER_SITE_OTHER): Payer: Commercial Managed Care - PPO | Admitting: Family Medicine

## 2013-05-27 VITALS — BP 120/80 | HR 67 | Temp 98.3°F | Resp 16 | Ht 63.75 in | Wt 139.8 lb

## 2013-05-27 DIAGNOSIS — K219 Gastro-esophageal reflux disease without esophagitis: Secondary | ICD-10-CM

## 2013-05-27 DIAGNOSIS — Z720 Tobacco use: Secondary | ICD-10-CM | POA: Insufficient documentation

## 2013-05-27 DIAGNOSIS — J00 Acute nasopharyngitis [common cold]: Secondary | ICD-10-CM

## 2013-05-27 DIAGNOSIS — J029 Acute pharyngitis, unspecified: Secondary | ICD-10-CM

## 2013-05-27 DIAGNOSIS — I10 Essential (primary) hypertension: Secondary | ICD-10-CM

## 2013-05-27 DIAGNOSIS — E785 Hyperlipidemia, unspecified: Secondary | ICD-10-CM

## 2013-05-27 MED ORDER — HYDROCHLOROTHIAZIDE 12.5 MG PO CAPS: 12.5000 mg | ORAL_CAPSULE | Freq: Every day | ORAL | Status: DC

## 2013-05-27 MED ORDER — LISINOPRIL 5 MG PO TABS: 5.0000 mg | ORAL_TABLET | Freq: Every day | ORAL | Status: DC

## 2013-05-27 NOTE — Progress Notes (Signed)
Subjective:  This chart was scribed for Alexander Ray, MD by Roxan Diesel, Scribe.  This patient was seen in [place] [facility] and the patient's care was started at 3:53 PM.   Patient ID: [name], male    DOB: 11-04-1960, 53 y.o.   MRN: [id number]  HPI  [name] is a 53 y.o. male PCP: No primary provider on file.   Pt presents for a f/u on HTN and hyperlipidemia.   1. Hypertension: Last office visit November 2014.  Had been started on lisinopril 5mg  prior to that visit due to elevated BP on just HCTZ 25mg .  Previously he had also been switched from HCTZ 12.5mg  caplets to 25mg  tablets.  At his last visit he reported occasional tickle in his throat which began after being switched from HCTZ caplets to tablets, but before he was started on lisinopril.  No changes in medicine at last visit.  He states he has continued to have a "tickle" and irritation in his throat, with some associated coughing spells.  He sometimes has needed to hang up on phone calls due to difficulty speaking.  He is also taking sleeping aids because it wakes him up.  He reports this began before he was started on lisinopril and he feels it may be due to switching his HCTZ from caplets to tablets.  2 weeks ago he lowered his dose of HCTZ back down from 25mg  tablets to 12.5 mg caplets and he states symptoms have improved since then.  He has been taking his pressure at home and it has mostly been good (119/79 a few nights ago).  Pt takes Zantac every day for heartburn.  He is not taking anything else for acid reflux.  He states he has taken other GERD medications in the past but stopped because whenever he missed a dose he thought it made his symptoms worse.  He denies unexpected weight loss, night sweats, or hemoptysis.  2. Hyperlipidemia: Last lipid panel was September 2014: total 158, HDL 35, LDL 92.  Taking half of a Crestor 10mg  each day.  3. Tobacco abuse: He is still smoking 1 1/2 packs per day.  He  reports that he has no interest in quitting smoking.   Patient Active Problem List   Diagnosis Date Noted  . Hyperlipidemia 05/27/2011  . HTN (hypertension) 05/27/2011    Past Medical History  Diagnosis Date  . Hyperlipidemia   . Hypertension     No past surgical history on file.  No Known Allergies  Prior to Admission medications   Medication Sig Start Date End Date Taking? Authorizing Provider  aspirin 81 MG tablet Take 81 mg by mouth daily.   Yes Historical Provider, MD  fish oil-omega-3 fatty acids 1000 MG capsule Take 2 g by mouth daily.   Yes Historical Provider, MD  lisinopril (PRINIVIL,ZESTRIL) 5 MG tablet Take 1 tablet (5 mg total) by mouth daily. 12/03/12  Yes Wendie Agreste, MD  Multiple Vitamin (MULTIVITAMIN) tablet Take 1 tablet by mouth daily.   Yes Historical Provider, MD  ranitidine (ZANTAC) 75 MG tablet Take 1 tablet (75 mg total) by mouth daily. 10/08/12  Yes Wendie Agreste, MD  rosuvastatin (CRESTOR) 10 MG tablet Take 0.5 tablets (5 mg total) by mouth daily. 10/08/12  Yes Wendie Agreste, MD  hydrochlorothiazide (HYDRODIURIL) 25 MG tablet Take 1 tablet (25 mg total) by mouth daily. 10/08/12   Wendie Agreste, MD    History   Social History  .  Marital Status: Single    Spouse Name: N/A    Number of Children: N/A  . Years of Education: N/A   Occupational History  . Not on file.   Social History Main Topics  . Smoking status: Current Every Day Smoker  . Smokeless tobacco: Not on file  . Alcohol Use: No  . Drug Use: No  . Sexual Activity: Not on file   Other Topics Concern  . Not on file   Social History Narrative  . No narrative on file     Review of Systems  Constitutional: Negative for diaphoresis, fatigue and unexpected weight change.  HENT:       Throat irritation  Eyes: Negative for visual disturbance.  Respiratory: Positive for cough. Negative for chest tightness and shortness of breath.   Cardiovascular: Negative for chest pain,  palpitations and leg swelling.  Gastrointestinal: Negative for abdominal pain and blood in stool.  Neurological: Negative for dizziness, light-headedness and headaches.       Objective:   Physical Exam  Vitals reviewed. Constitutional: He is oriented to person, place, and time. He appears well-developed and well-nourished.  HENT:  Head: Normocephalic and atraumatic.  Eyes: EOM are normal. Pupils are equal, round, and reactive to light.  Neck: No JVD present. Carotid bruit is not present. No tracheal deviation present. No thyromegaly present.  Cardiovascular: Normal rate, regular rhythm and normal heart sounds.   No murmur heard. Pulmonary/Chest: Effort normal and breath sounds normal. He has no rales.  Musculoskeletal: He exhibits no edema.  Lymphadenopathy:    He has no cervical adenopathy.  Neurological: He is alert and oriented to person, place, and time.  Skin: Skin is warm and dry.  Psychiatric: He has a normal mood and affect.     Filed Vitals:   05/27/13 1520  BP: 120/80  Pulse: 67  Temp: 98.3 F (36.8 C)  TempSrc: Oral  Resp: 16  Height: 5' 3.75" (1.619 m)  Weight: 139 lb 12.8 oz (63.413 kg)  SpO2: 97%   Body mass index is 24.19 kg/(m^2).     Assessment & Plan:   [name] is a 53 y.o. male HTN (hypertension) - Plan: lisinopril (PRINIVIL,ZESTRIL) 5 MG tablet, hydrochlorothiazide (MICROZIDE) 12.5 MG capsule, COMPLETE METABOLIC PANEL WITH GFR  -as controlled with HCTZ 12.5mg  and lisinopril 5mg , will change back to capsules (12.5mg ) of HCTZ.   Other and unspecified hyperlipidemia - Plan: COMPLETE METABOLIC PANEL WITH GFR, Lipid panel - lipids pending. Cont crestor at low dose for now (5mg ) as tolerating this.   Esophageal reflux, Sore throat - suspect LPR/GERD as cause of throat sx's, not lisinopril and doubt HCTZ in tablet form would cause this. Can try PPI QD (in addition to H2 blocker overlap for a few weeks), but with smoking history if this dose  not resolve in next 1 month - refer to ENT for eval and possible laryngoscopy. Sooner if any worsening.    Meds ordered this encounter  Medications  . lisinopril (PRINIVIL,ZESTRIL) 5 MG tablet    Sig: Take 1 tablet (5 mg total) by mouth daily.    Dispense:  90 tablet    Refill:  1  . hydrochlorothiazide (MICROZIDE) 12.5 MG capsule    Sig: Take 1 capsule (12.5 mg total) by mouth daily.    Dispense:  90 capsule    Refill:  1   Patient Instructions  You can continue on the 12.5mg  capsules of HCTZ, and add prilosec once per day for acid reflux (  can continue zantac for next week or two).   If this does not resolve your symptoms in the next 1 month, then you need to see ENT to look at this area, especially with smoking. Let me know by phone call or [place] email how you are doing at that time.  When you are ready to quit smoking, please let me know and I will provide resources to help with this.   You should receive a call or letter about your lab results within the next week to 10 days, and can look up results on MyChart.   You should receive a call or letter about your lab results within the next week to 10 days.        I personally performed the services described in this documentation, which was scribed in my presence. The recorded information has been reviewed and considered, and addended by me as needed.

## 2013-05-27 NOTE — Patient Instructions (Addendum)
You can continue on the 12.5mg  capsules of HCTZ, and add prilosec once per day for acid reflux (can continue zantac for next week or two).   If this does not resolve your symptoms in the next 1 month, then you need to see ENT to look at this area, especially with smoking. Let me know by phone call or [place] email how you are doing at that time.  When you are ready to quit smoking, please let me know and I will provide resources to help with this.   You should receive a call or letter about your lab results within the next week to 10 days, and can look up results on MyChart.   You should receive a call or letter about your lab results within the next week to 10 days.

## 2013-05-28 LAB — LIPID PANEL
CHOL/HDL RATIO: 4.8 ratio
Cholesterol: 190 mg/dL (ref 0–200)
HDL: 40 mg/dL (ref >39)
LDL CALC: 118 mg/dL — AB (ref 0–99)
Triglycerides: 159 mg/dL — ABNORMAL HIGH (ref <150)
VLDL: 32 mg/dL (ref 0–40)

## 2013-05-28 LAB — COMPLETE METABOLIC PANEL WITH GFR
ALK PHOS: 86 U/L (ref 39–117)
ALT: 33 U/L (ref 0–53)
AST: 25 U/L (ref 0–37)
Albumin: 4.5 g/dL (ref 3.5–5.2)
BUN: 17 mg/dL (ref 6–23)
CALCIUM: 9.7 mg/dL (ref 8.4–10.5)
CHLORIDE: 99 meq/L (ref 96–112)
CO2: 27 mEq/L (ref 19–32)
Creat: 1.05 mg/dL (ref 0.50–1.35)
GFR, Est African American: >89 mL/min
GFR, Est Non African American: 80 mL/min
Glucose, Bld: 89 mg/dL (ref 70–99)
POTASSIUM: 4.3 meq/L (ref 3.5–5.3)
SODIUM: 135 meq/L (ref 135–145)
TOTAL PROTEIN: 7 g/dL (ref 6.0–8.3)
Total Bilirubin: 1.5 mg/dL — ABNORMAL HIGH (ref 0.2–1.2)

## 2013-10-25 ENCOUNTER — Other Ambulatory Visit: Payer: Self-pay | Admitting: Family Medicine

## 2013-12-02 ENCOUNTER — Ambulatory Visit (INDEPENDENT_AMBULATORY_CARE_PROVIDER_SITE_OTHER): Payer: Commercial Managed Care - PPO | Admitting: Family Medicine

## 2013-12-02 ENCOUNTER — Encounter: Payer: Self-pay | Admitting: Family Medicine

## 2013-12-02 VITALS — BP 129/72 | HR 75 | Temp 98.4°F | Resp 16 | Ht 64.0 in | Wt 141.4 lb

## 2013-12-02 DIAGNOSIS — E785 Hyperlipidemia, unspecified: Secondary | ICD-10-CM

## 2013-12-02 DIAGNOSIS — I10 Essential (primary) hypertension: Secondary | ICD-10-CM

## 2013-12-02 DIAGNOSIS — M79671 Pain in right foot: Secondary | ICD-10-CM

## 2013-12-02 DIAGNOSIS — D239 Other benign neoplasm of skin, unspecified: Secondary | ICD-10-CM

## 2013-12-02 DIAGNOSIS — M79676 Pain in unspecified toe(s): Secondary | ICD-10-CM

## 2013-12-02 DIAGNOSIS — D229 Melanocytic nevi, unspecified: Secondary | ICD-10-CM

## 2013-12-02 MED ORDER — LISINOPRIL 5 MG PO TABS: 5.0000 mg | ORAL_TABLET | Freq: Every day | ORAL | Status: DC

## 2013-12-02 MED ORDER — ROSUVASTATIN CALCIUM 10 MG PO TABS: ORAL_TABLET | ORAL | Status: DC

## 2013-12-02 MED ORDER — HYDROCHLOROTHIAZIDE 12.5 MG PO CAPS: 12.5000 mg | ORAL_CAPSULE | Freq: Every day | ORAL | Status: DC

## 2013-12-02 NOTE — Progress Notes (Signed)
Subjective:    Patient ID: [name], male    DOB: February 01, 1960, 53 y.o.   MRN: [id number]  HPI [name] is a 53 y.o. male  Here for follow up, with few concerns:  Hyperlipidemia - out of Crestor past week. Prior to that 1/2 of 10mg  qd. Had 15 filled prior d/t cost - needs new voucher.  Had myalgais with 10mg , but ok with 1/2. Last lipids on 5mg  dose: Lab Results  Component Value Date   CHOL 190 05/27/2013   HDL 40 05/27/2013   LDLCALC 118* 05/27/2013   TRIG 159* 05/27/2013   CHOLHDL 4.8 05/27/2013  fasting labs drawn this am.   Abnormal area on R chest - past month. Fades, then returns, no other sx's. 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  Not seen in past 2 years. Plans on seeing [facility] derm for eval.   R heel pain - past few months, about the same.  No worsening. No treatments. Most sore walking around work - no change in morning or night.   HTN - no side effects with meds. Home BP's 135/87. Fatigued at times, no change. Not exercising.  Unknown reason. Asa 81mg  qd.   Tobacco abuse- 1 and 1/2 ppd,  No plans on quitting at this time. Will let me know when ready.   Great toes sore at times - feels like curving more in as getting older. No acute changes, but sore to press on boots. No discharge.     Review of Systems  Constitutional: Positive for fatigue (occasionally tired  - no changes recently. ). Negative for unexpected weight change.  Eyes: Negative for visual disturbance.  Respiratory: Positive for cough (now resolved - had 2 weeks prior. ). Negative for chest tightness and shortness of breath.   Cardiovascular: Negative for chest pain, palpitations and leg swelling.  Gastrointestinal: Negative for abdominal pain and blood in stool.  Skin:       R upper chest lesion.   Neurological: Negative for dizziness, light-headedness and headaches.       Objective:   Physical Exam  Constitutional: He is oriented to person, place, and  time. He appears well-developed and well-nourished.  HENT:  Head: Normocephalic and atraumatic.  Eyes: EOM are normal. Pupils are equal, round, and reactive to light.  Neck: No JVD present. Carotid bruit is not present.  Cardiovascular: Normal rate, regular rhythm and normal heart sounds.   No murmur heard. Pulmonary/Chest: Effort normal and breath sounds normal. He has no rales.  Musculoskeletal: He exhibits no edema.       Right foot: There is tenderness.       Feet:  Neurological: He is alert and oriented to person, place, and time.  Skin: Skin is warm and dry.     Psychiatric: He has a normal mood and affect.  Vitals reviewed.  Filed Vitals:   12/02/13 1514  BP: 129/72  Pulse: 75  Temp: 98.4 F (36.9 C)  TempSrc: Oral  Resp: 16  Height: 5\' 4"  (1.626 m)  Weight: 141 lb 6.4 oz (64.139 kg)  SpO2: 98%        Assessment & Plan:   [name] is a 53 y.o. male Essential hypertension - Plan: hydrochlorothiazide (MICROZIDE) 12.5 MG capsule, lisinopril (PRINIVIL,ZESTRIL) 5 MG tablet  - overall stable. No changes in meds.   Hyperlipidemia - Plan: COMPLETE METABOLIC PANEL WITH GFR, Lipid panel, rosuvastatin (CRESTOR) 10 MG tablet, DISCONTINUED: rosuvastatin (CRESTOR)  10 MG tablet  - continue Crestor at 5mg  dose as tolerating this at present. Lipids, CMP pending.   Atypical nevus - Plan: Ambulatory referral to Dermatology  - Hx of squamous cell CA. New lesion on chest possible early SCC. Referral to dermatology.   Heel pain, right - Plan: Ambulatory referral to Podiatry  -Trial of Tuli heel cup.  Does not appear to be plantar fasciitis. Can discuss with podiatry as well.   Great toe pain, unspecified laterality - Plan: Ambulatory referral to Podiatry  - curved toenails appear to be prone to ingrown nail. eval by podiatry.   Meds ordered this encounter  Medications  . hydrochlorothiazide (MICROZIDE) 12.5 MG capsule    Sig: Take 1 capsule (12.5 mg total) by mouth  daily.    Dispense:  90 capsule    Refill:  1  . DISCONTD: rosuvastatin (CRESTOR) 10 MG tablet    Sig: TAKE 1/2 TABLET(5MG ) BY MOUTH DAILY    Dispense:  45 tablet    Refill:  1  . lisinopril (PRINIVIL,ZESTRIL) 5 MG tablet    Sig: Take 1 tablet (5 mg total) by mouth daily.    Dispense:  90 tablet    Refill:  1  . rosuvastatin (CRESTOR) 10 MG tablet    Sig: TAKE 1/2 TABLET(5MG ) BY MOUTH DAILY    Dispense:  15 tablet    Refill:  0   Patient Instructions  Try Heel cup - [employer] usually carries these.   No change in meds today. You should receive a call or letter about your lab results within the next week to 10 days.   We will refer you to podiatry and Dr. Allyson Sabal.   Return to the clinic or go to the nearest emergency room if any of your symptoms worsen or new symptoms occur.

## 2013-12-02 NOTE — Patient Instructions (Signed)
Try Heel cup - [employer] usually carries these.   No change in meds today. You should receive a call or letter about your lab results within the next week to 10 days.   We will refer you to podiatry and Dr. Allyson Sabal.   Return to the clinic or go to the nearest emergency room if any of your symptoms worsen or new symptoms occur.

## 2013-12-02 NOTE — Progress Notes (Signed)
   Subjective:    Patient ID: [name], male    DOB: 1960-09-13, 53 y.o.   MRN: [id number]  HPI  Diet  ;activity HCTZ Lisinopril Asa Missed doses Side effects  Flu vaccine  Review of Systems     Objective:   Physical Exam        Assessment & Plan:

## 2013-12-03 LAB — COMPLETE METABOLIC PANEL WITH GFR
ALBUMIN: 4 g/dL (ref 3.5–5.2)
ALT: 34 U/L (ref 0–53)
AST: 24 U/L (ref 0–37)
Alkaline Phosphatase: 83 U/L (ref 39–117)
BUN: 12 mg/dL (ref 6–23)
CALCIUM: 9.4 mg/dL (ref 8.4–10.5)
CHLORIDE: 101 meq/L (ref 96–112)
CO2: 31 meq/L (ref 19–32)
Creat: 0.96 mg/dL (ref 0.50–1.35)
GFR, Est Non African American: 89 mL/min
Glucose, Bld: 84 mg/dL (ref 70–99)
POTASSIUM: 3.9 meq/L (ref 3.5–5.3)
SODIUM: 137 meq/L (ref 135–145)
Total Bilirubin: 0.6 mg/dL (ref 0.2–1.2)
Total Protein: 6.6 g/dL (ref 6.0–8.3)

## 2013-12-03 LAB — LIPID PANEL
Cholesterol: 187 mg/dL (ref 0–200)
HDL: 34 mg/dL — ABNORMAL LOW (ref >39)
LDL CALC: 105 mg/dL — AB (ref 0–99)
Total CHOL/HDL Ratio: 5.5 Ratio
Triglycerides: 240 mg/dL — ABNORMAL HIGH (ref <150)
VLDL: 48 mg/dL — ABNORMAL HIGH (ref 0–40)

## 2014-03-04 ENCOUNTER — Other Ambulatory Visit: Payer: Self-pay | Admitting: Family Medicine

## 2014-03-08 ENCOUNTER — Telehealth: Payer: Self-pay

## 2014-03-08 DIAGNOSIS — I10 Essential (primary) hypertension: Secondary | ICD-10-CM

## 2014-03-08 NOTE — Telephone Encounter (Signed)
Rx refill of lisinopril

## 2014-03-11 ENCOUNTER — Other Ambulatory Visit: Payer: Self-pay

## 2014-03-11 DIAGNOSIS — I10 Essential (primary) hypertension: Secondary | ICD-10-CM

## 2014-03-11 MED ORDER — LISINOPRIL 5 MG PO TABS: 5.0000 mg | ORAL_TABLET | Freq: Every day | ORAL | Status: DC

## 2014-03-11 NOTE — Telephone Encounter (Signed)
Re-sent RF that should be on file w/note for pharm. Notified pt on VM and asked that he contact his pharm first in future.

## 2014-03-17 ENCOUNTER — Encounter: Payer: Commercial Managed Care - PPO | Admitting: Family Medicine

## 2014-03-26 ENCOUNTER — Ambulatory Visit (INDEPENDENT_AMBULATORY_CARE_PROVIDER_SITE_OTHER): Payer: Commercial Managed Care - PPO | Admitting: Internal Medicine

## 2014-03-26 ENCOUNTER — Encounter: Payer: Self-pay | Admitting: Internal Medicine

## 2014-03-26 VITALS — BP 136/82 | HR 61 | Temp 97.8°F | Resp 16 | Ht 64.0 in | Wt 144.6 lb

## 2014-03-26 DIAGNOSIS — R972 Elevated prostate specific antigen [PSA]: Secondary | ICD-10-CM

## 2014-03-26 DIAGNOSIS — Z720 Tobacco use: Secondary | ICD-10-CM | POA: Diagnosis not present

## 2014-03-26 DIAGNOSIS — E785 Hyperlipidemia, unspecified: Secondary | ICD-10-CM

## 2014-03-26 DIAGNOSIS — I10 Essential (primary) hypertension: Secondary | ICD-10-CM

## 2014-03-26 DIAGNOSIS — Z125 Encounter for screening for malignant neoplasm of prostate: Secondary | ICD-10-CM | POA: Diagnosis not present

## 2014-03-26 DIAGNOSIS — Z0000 Encounter for general adult medical examination without abnormal findings: Secondary | ICD-10-CM

## 2014-03-26 DIAGNOSIS — Z23 Encounter for immunization: Secondary | ICD-10-CM | POA: Diagnosis not present

## 2014-03-26 LAB — POCT URINALYSIS DIPSTICK
Bilirubin, UA: NEGATIVE
Blood, UA: NEGATIVE
Glucose, UA: NEGATIVE
KETONES UA: NEGATIVE
Nitrite, UA: NEGATIVE
PH UA: 6.5
PROTEIN UA: NEGATIVE
SPEC GRAV UA: 1.015
Urobilinogen, UA: 0.2

## 2014-03-26 LAB — LIPID PANEL
CHOLESTEROL: 158 mg/dL (ref 0–200)
HDL: 28 mg/dL — ABNORMAL LOW (ref >=40)
LDL Cholesterol: 103 mg/dL — ABNORMAL HIGH (ref 0–99)
TRIGLYCERIDES: 135 mg/dL (ref <150)
Total CHOL/HDL Ratio: 5.6 Ratio
VLDL: 27 mg/dL (ref 0–40)

## 2014-03-26 NOTE — Progress Notes (Signed)
Subjective:    Patient ID: [name], male    DOB: December 11, 1960, 54 y.o.   MRN: [id number]  HPI annual physical exam Usually followed by Dr. Nyoka Cowden but no appt was available within his insurance parameters Patient Active Problem List   Diagnosis Date Noted  . Tobacco abuse 05/27/2013  . Hyperlipidemia 05/27/2011  . HTN (hypertension) 05/27/2011   former long-distance biker who has been sedentary over the last 18 months because he discontinued biking and is job changed no longer encourages activity. Works in Engineer, technical sales. He has accumulated various aches and pains as a result of his inactivity but nothing disabling. He continues to smoke as he has since high school and is not interested in quitting.  Immunizations up-to-date except needs Tdap Social history stable    Prior to Admission medications   Medication Sig Start Date End Date Taking? Authorizing Provider  aspirin 81 MG tablet Take 81 mg by mouth daily.   Yes Historical Provider, MD  fish oil-omega-3 fatty acids 1000 MG capsule Take 2 g by mouth daily.   Yes Historical Provider, MD  hydrochlorothiazide (MICROZIDE) 12.5 MG capsule Take 1 capsule (12.5 mg total) by mouth daily. 12/02/13  Yes Wendie Agreste, MD  lisinopril (PRINIVIL,ZESTRIL) 5 MG tablet Take 1 tablet (5 mg total) by mouth daily. 03/11/14  Yes Wendie Agreste, MD  Multiple Vitamin (MULTIVITAMIN) tablet Take 1 tablet by mouth daily.   Yes Historical Provider, MD  ranitidine (ZANTAC) 75 MG tablet Take 1 tablet (75 mg total) by mouth daily. 10/08/12  when necessary  Yes Wendie Agreste, MD  rosuvastatin (CRESTOR) 10 MG tablet TAKE 1/2 TABLET(5MG ) BY MOUTH DAILY 12/02/13  Yes Wendie Agreste, MD   Blood pressures as low as 112/60 recorded but also as high as 140/90 at home   Review of Systems  Constitutional: Negative.   HENT: Negative.   Eyes: Negative.   Respiratory: Negative.   Cardiovascular: Negative for chest pain, palpitations and leg swelling.    Gastrointestinal: Negative.   Endocrine: Positive for polydipsia.       He keeps water with him all the time and sips whenever his mouth feels dry--right up until bedtime. Then he has nocturia 2. He has had no significant weight fluctuations and has no daytime urinary frequency or polyphagia.  Genitourinary: Negative for dysuria, frequency and hematuria.  Musculoskeletal: Negative.   Skin: Negative.   Allergic/Immunologic: Negative.   Neurological: Negative for tremors, weakness, numbness and headaches.       Occasionally lightheaded but no dizziness or syncope  Hematological: Negative.   Psychiatric/Behavioral: Negative.        Objective:   Physical Exam  Constitutional: He is oriented to person, place, and time. He appears well-developed and well-nourished.  HENT:  Head: Normocephalic and atraumatic.  Right Ear: Hearing, tympanic membrane, external ear and ear canal normal.  Left Ear: Hearing, tympanic membrane, external ear and ear canal normal.  Nose: Nose normal.  Mouth/Throat: Uvula is midline, oropharynx is clear and moist and mucous membranes are normal.  Eyes: Conjunctivae, EOM and lids are normal. Pupils are equal, round, and reactive to light. Right eye exhibits no discharge. Left eye exhibits no discharge. No scleral icterus.  Neck: Trachea normal and normal range of motion. Neck supple. Carotid bruit is not present.  Cardiovascular: Normal rate, regular rhythm, normal heart sounds, intact distal pulses and normal pulses.   No murmur heard. Pulmonary/Chest: Effort normal and breath sounds normal. No respiratory distress. He has  no wheezes. He has no rhonchi. He has no rales.  Abdominal: Soft. Normal appearance and bowel sounds are normal. He exhibits no abdominal bruit. There is no tenderness.  Genitourinary: Prostate normal.  Musculoskeletal: Normal range of motion. He exhibits no edema or tenderness.  Lymphadenopathy:       Head (right side): No submental, no  submandibular, no tonsillar, no preauricular, no posterior auricular and no occipital adenopathy present.       Head (left side): No submental, no submandibular, no tonsillar, no preauricular, no posterior auricular and no occipital adenopathy present.    He has no cervical adenopathy.  Neurological: He is alert and oriented to person, place, and time. He has normal strength and normal reflexes. No cranial nerve deficit or sensory deficit. Coordination and gait normal.  Skin: Skin is warm, dry and intact. No lesion and no rash noted.  Psychiatric: He has a normal mood and affect. His speech is normal and behavior is normal. Judgment and thought content normal.   BP 136/82 mmHg  Pulse 61  Temp(Src) 97.8 F (36.6 C) (Oral)  Resp 16  Ht 5\' 4"  (1.626 m)  Wt 144 lb 9.6 oz (65.59 kg)  BMI 24.81 kg/m2  SpO2 97%        Assessment & Plan:  Essential hypertension - Plan: POCT urinalysis dipstick  Hyperlipidemia - Plan: Lipid panel  Screening for prostate cancer - Plan: PSA  Need for Tdap vaccination - Plan: Tdap vaccine greater than or equal to 7yo IM  Tobacco abuse--- his reluctance to discuss cessation seems incompatible with his history of exercise but he is not willing to consider  Annual examination   Addendum labs Results for orders placed or performed in visit on 03/26/14  Lipid panel  Result Value Ref Range   Cholesterol 158 0 - 200 mg/dL   Triglycerides 135 <150 mg/dL   HDL 28 (L) >=40 mg/dL   Total CHOL/HDL Ratio 5.6 Ratio   VLDL 27 0 - 40 mg/dL   LDL Cholesterol 103 (H)--- stable on Crestor  0 - 99 mg/dL  PSA  Result Value Ref Range   PSA--- this represents a doubling of his PSA value since last checked in 2014 and so he needs referral to urology  3.33 <=4.00 ng/mL  POCT urinalysis dipstick  Result Value Ref Range   Color, UA yellow    Clarity, UA clear    Glucose, UA neg    Bilirubin, UA neg    Ketones, UA neg    Spec Grav, UA 1.015    Blood, UA neg    pH,  UA 6.5    Protein, UA neg    Urobilinogen, UA 0.2    Nitrite, UA neg    Leukocytes, UA Trace

## 2014-03-27 ENCOUNTER — Encounter: Payer: Self-pay | Admitting: Internal Medicine

## 2014-03-27 DIAGNOSIS — R972 Elevated prostate specific antigen [PSA]: Secondary | ICD-10-CM | POA: Insufficient documentation

## 2014-03-27 LAB — PSA: PSA: 3.33 ng/mL (ref <=4.00)

## 2014-03-27 MED ORDER — HYDROCHLOROTHIAZIDE 12.5 MG PO CAPS: 12.5000 mg | ORAL_CAPSULE | Freq: Every day | ORAL | Status: DC

## 2014-03-27 MED ORDER — ROSUVASTATIN CALCIUM 10 MG PO TABS
ORAL_TABLET | ORAL | Status: DC
Start: 2014-03-27 — End: 2015-06-05

## 2014-03-27 MED ORDER — LISINOPRIL 5 MG PO TABS: 5.0000 mg | ORAL_TABLET | Freq: Every day | ORAL | Status: DC

## 2014-06-02 ENCOUNTER — Ambulatory Visit: Payer: Commercial Managed Care - PPO | Admitting: Family Medicine

## 2014-10-06 ENCOUNTER — Ambulatory Visit: Payer: Commercial Managed Care - PPO | Admitting: Family Medicine

## 2015-06-05 ENCOUNTER — Other Ambulatory Visit: Payer: Self-pay | Admitting: Physician Assistant

## 2015-06-05 ENCOUNTER — Telehealth: Payer: Self-pay

## 2015-06-05 DIAGNOSIS — E785 Hyperlipidemia, unspecified: Secondary | ICD-10-CM

## 2015-06-05 DIAGNOSIS — I10 Essential (primary) hypertension: Secondary | ICD-10-CM

## 2015-06-05 MED ORDER — LISINOPRIL 5 MG PO TABS: 5.0000 mg | ORAL_TABLET | Freq: Every day | ORAL | Status: DC

## 2015-06-05 MED ORDER — HYDROCHLOROTHIAZIDE 12.5 MG PO CAPS: 12.5000 mg | ORAL_CAPSULE | Freq: Every day | ORAL | Status: DC

## 2015-06-05 MED ORDER — ROSUVASTATIN CALCIUM 10 MG PO TABS: ORAL_TABLET | ORAL | Status: DC

## 2015-06-05 NOTE — Telephone Encounter (Signed)
I spoke to Judson Roch about refilling the patient's prescriptions.  He recently lost his job and cant afford an OV.  He needs refills on his hydrochlorothiazide, lisinopril and crestor.  He has only been taking BP med once every 3 days trying to make them last, but his BP has steady been rising.  He is aware that we will refill a one-month supply and that he would have to be seen after that.  I told him about our 55% discount and [facility]'s assistance program.  He wants these called into [facility] at [address].  His number is [phone]

## 2015-06-05 NOTE — Telephone Encounter (Signed)
Done

## 2015-08-12 ENCOUNTER — Ambulatory Visit (INDEPENDENT_AMBULATORY_CARE_PROVIDER_SITE_OTHER): Payer: Self-pay | Admitting: Family Medicine

## 2015-08-12 VITALS — BP 158/100 | HR 90 | Temp 98.2°F | Resp 18 | Ht 64.0 in | Wt 147.0 lb

## 2015-08-12 DIAGNOSIS — I10 Essential (primary) hypertension: Secondary | ICD-10-CM

## 2015-08-12 DIAGNOSIS — R319 Hematuria, unspecified: Secondary | ICD-10-CM

## 2015-08-12 DIAGNOSIS — E785 Hyperlipidemia, unspecified: Secondary | ICD-10-CM

## 2015-08-12 DIAGNOSIS — M545 Low back pain, unspecified: Secondary | ICD-10-CM

## 2015-08-12 DIAGNOSIS — F4321 Adjustment disorder with depressed mood: Secondary | ICD-10-CM

## 2015-08-12 LAB — POC MICROSCOPIC URINALYSIS (UMFC): Mucus: ABSENT

## 2015-08-12 LAB — POCT URINALYSIS DIP (MANUAL ENTRY)
BILIRUBIN UA: NEGATIVE
Bilirubin, UA: NEGATIVE
Glucose, UA: NEGATIVE
LEUKOCYTES UA: NEGATIVE
NITRITE UA: NEGATIVE
PH UA: 7
Spec Grav, UA: 1.01
Urobilinogen, UA: 0.2

## 2015-08-12 MED ORDER — LISINOPRIL 5 MG PO TABS: 5.0000 mg | ORAL_TABLET | Freq: Every day | ORAL | Status: DC

## 2015-08-12 MED ORDER — HYDROCHLOROTHIAZIDE 12.5 MG PO CAPS: 12.5000 mg | ORAL_CAPSULE | Freq: Every day | ORAL | Status: DC

## 2015-08-12 MED ORDER — ROSUVASTATIN CALCIUM 10 MG PO TABS: ORAL_TABLET | ORAL | Status: DC

## 2015-08-12 NOTE — Progress Notes (Signed)
Subjective:  By signing my name below, I, [name], attest that this documentation has been prepared under the direction and in the presence of Merri Ray, MD.  Electronically Signed: Thea Alken, ED Scribe. 08/12/2015. 2:56 PM.   Patient ID: [name], male    DOB: [birth year], 55 y.o.   MRN: [id number]  HPI Chief Complaint  Patient presents with  . Medication Refill    lisinopril and hydrochlorothiazide     HPI Comments: [name] is a 55 y.o. male who presents to the [facility] for follow up of HTN.   HTN Last visit with me 11/2013. Hx of HTN and HLD. Asprin 81mg  a day. He was controlled in 03/2014 and was continues on HCTZ 12.5 mg and lisinoril 5 mg qd. Phone note reviewed from may. Spacing out medication due to cost and has recently lost his job.   Lab Results  Component Value Date   CREATININE 0.96 12/02/2013   Pt has been taking medication every other day. Home reading have been 150/100 but will fluctuate. He has increased alcohol consumption about 4 drinks a week since losing his job. He does not feel he is addicted to alcohol or has trouble cutting back. He has not been exercising or feeling like going out. He admits feeling being down which he states was worse about 6 months ago. He finds himself sleeping during the day and irritable easily. He reports having SI about 6 months ago but none recently. Pt states he is able go straight to ED if he has recurrent SI. He does not want to meet with counselor due to cost.   HLD  He was off of Crestor 11/2013, last visit with me. Did not tolerate 10 mg dose but did okay with 5 mg dose. He is tolerating Crestor without adverse affect including muscle aches.  Lab Results  Component Value Date   ALT 34 12/02/2013   AST 24 12/02/2013   ALKPHOS 83 12/02/2013   BILITOT 0.6 12/02/2013   Lab Results  Component Value Date   CHOL 158 03/26/2014   HDL 28* 03/26/2014   LDLCALC 103* 03/26/2014     TRIG 135 03/26/2014   CHOLHDL 5.6 03/26/2014   Tobacco abuse 1.5 pack a day at last visit with no plan of quitting. He was seen for annual exam with Dr. Laney Pastor 03/2014. He was still not interested in quitting at that time. Pt smokes 2 packs a day. No plan of quitting at this time.   Back pain Pt also notes back spasm. He took a left over muscle relaxer today that he cut in half with relief to pain. Prior to arrival he noticed blood in his urine.    Patient Active Problem List   Diagnosis Date Noted  . Abnormal PSA-rate of rise 03/27/2014  . Tobacco abuse 05/27/2013  . Hyperlipidemia 05/27/2011  . HTN (hypertension) 05/27/2011   Past Medical History  Diagnosis Date  . Hyperlipidemia   . Hypertension   . Anxiety   . Cataract   . Depression   . GERD (gastroesophageal reflux disease)    No past surgical history on file. No Known Allergies Prior to Admission medications   Medication Sig Start Date End Date Taking? Authorizing Provider  aspirin 81 MG tablet Take 81 mg by mouth daily.   Yes Historical Provider, MD  fish oil-omega-3 fatty acids 1000 MG capsule Take 2 g by mouth daily.   Yes Historical Provider, MD  hydrochlorothiazide (  MICROZIDE) 12.5 MG capsule Take 1 capsule (12.5 mg total) by mouth daily. 06/05/15  Yes Mancel Bale, PA-C  lisinopril (PRINIVIL,ZESTRIL) 5 MG tablet Take 1 tablet (5 mg total) by mouth daily. 06/05/15  Yes Mancel Bale, PA-C  Multiple Vitamin (MULTIVITAMIN) tablet Take 1 tablet by mouth daily.   Yes Historical Provider, MD  ranitidine (ZANTAC) 75 MG tablet Take 1 tablet (75 mg total) by mouth daily. 10/08/12  Yes Wendie Agreste, MD  rosuvastatin (CRESTOR) 10 MG tablet TAKE 1/2 TABLET(5MG ) BY MOUTH DAILY 06/05/15  Yes Mancel Bale, PA-C   Social History   Social History  . Marital Status: Single    Spouse Name: N/A  . Number of Children: N/A  . Years of Education: N/A   Occupational History  . Not on file.   Social History Main Topics  .  Smoking status: Current Every Day Smoker  . Smokeless tobacco: Not on file  . Alcohol Use: No  . Drug Use: No  . Sexual Activity: Not on file   Other Topics Concern  . Not on file   Social History Narrative   Review of Systems  Constitutional: Negative for fatigue and unexpected weight change.  Eyes: Negative for visual disturbance.  Respiratory: Negative for cough, chest tightness and shortness of breath.   Cardiovascular: Negative for chest pain, palpitations and leg swelling.  Gastrointestinal: Negative for abdominal pain and blood in stool.  Genitourinary: Positive for hematuria.  Musculoskeletal: Positive for myalgias and back pain.  Neurological: Negative for dizziness, light-headedness and headaches.  Psychiatric/Behavioral: Positive for agitation. Negative for suicidal ideas.     Objective:   Physical Exam  Constitutional: He is oriented to person, place, and time. He appears well-developed and well-nourished. No distress.  HENT:  Head: Normocephalic and atraumatic.  Eyes: Conjunctivae and EOM are normal. Pupils are equal, round, and reactive to light.  Neck: Neck supple. No JVD present. Carotid bruit is not present.  Cardiovascular: Normal rate, regular rhythm and normal heart sounds.   No murmur heard. Pulmonary/Chest: Effort normal and breath sounds normal. He has no rales.  Abdominal: Soft. He exhibits no distension. There is no tenderness. There is no CVA tenderness.  Musculoskeletal: Normal range of motion. He exhibits no edema.  Neurological: He is alert and oriented to person, place, and time.  Skin: Skin is warm and dry.  Psychiatric: He has a normal mood and affect. His behavior is normal.  Nursing note and vitals reviewed.  Filed Vitals:   08/12/15 1434  BP: 158/100  Pulse: 90  Temp: 98.2 F (36.8 C)  TempSrc: Oral  Resp: 18  Height: 5\' 4"  (1.626 m)  Weight: 147 lb (66.679 kg)  SpO2: 97%    Assessment & Plan:   [name] is a 55 y.o.  male Essential hypertension - Plan: hydrochlorothiazide (MICROZIDE) 12.5 MG capsule, lisinopril (PRINIVIL,ZESTRIL) 5 MG tablet, COMPLETE METABOLIC PANEL WITH GFR, Lipid panel  -Decreased control with being off meds, or spacing them out recently.  -Restart daily HCTZ and lisinopril, check CMP, lipid panel when fasting for lab only visit within the next week.  -Check home blood pressures on meds, and if remains over 140/90, call for discussion of med changes. In the meantime, increase activity, decreased alcohol to no more than 2 drinks when he does drink.  Hyperlipidemia - Plan: COMPLETE METABOLIC PANEL WITH GFR, Lipid panel, rosuvastatin (CRESTOR) 10 MG tablet  -Has tolerated 5 mg Crestor previously. Plan on lipid panel, CMP with  fasting lab only visit  Adjustment disorder with depressed mood  -Due to being out of work likely, with some increased depression symptoms and SI initially. Denies recent suicidal ideation//intent/plan, and ER/911 precautions discussed if those returned. Cautioned about alcohol use/overuse, and RTC precautions given  -Planning on increase activity with walking/exercise that may help with both blood pressure and mood. Counseling numbers given, and handout given on adjustment disorder. Follow-up if symptoms persist or worsen.  Hematuria - Plan: POCT urinalysis dipstick, POCT Microscopic Urinalysis ([facility]) Left-sided low back pain without sciatica  -Episode today. Asymptomatic in regards to back pain currently, still with hematuria. Possible nephrolithiasis, now improved. Plan on recheck urinalysis within the next 6 weeks, but if persistent hematuria, or recurrence of back pain, RTC or ER for eval.  Meds ordered this encounter  Medications  . hydrochlorothiazide (MICROZIDE) 12.5 MG capsule    Sig: Take 1 capsule (12.5 mg total) by mouth daily.    Dispense:  90 capsule    Refill:  1  . lisinopril (PRINIVIL,ZESTRIL) 5 MG tablet    Sig: Take 1 tablet (5 mg total) by mouth  daily.    Dispense:  90 tablet    Refill:  1  . rosuvastatin (CRESTOR) 10 MG tablet    Sig: TAKE 1/2 TABLET(5MG ) BY MOUTH DAILY    Dispense:  45 tablet    Refill:  1   Patient Instructions    Try cutting back to no more than 1-2 alcoholic drinks per day, start walking for exercise and I will provide some resources for counseling. Follow up if you are still feeling down or depressed after increasing exercise. If any suicide thoughts - call 911 or go to the emergency room.  Vivia Budge: [id number] Arvil [id number] [phone]  Keep a record of your blood pressures outside of the office and if persistently elevated over 140/90 in next 2-3 weeks - let me know so we can change the medication.   Continue 1/2 pill Crestor each day.  When you are ready to quit smoking, let me know if I can help.  [place] offers smoking cessation clinics. Registration is required. To register call [phone] or register online at [email or web address]  Back pain followed by blood in the urine today may have been due to a kidney stone. If you have any return of the pain, or blood in the urine persists in the next day or 2, return to recheck and discuss further. Otherwise we need to recheck your urine test in the next 4-6 weeks to make sure this resolves, especially with history of smoking.   Return to the clinic or go to the nearest emergency room if any of your symptoms worsen or new symptoms occur.   Hematuria, Adult Hematuria is blood in your urine. It can be caused by a bladder infection, kidney infection, prostate infection, kidney stone, or cancer of your urinary tract. Infections can usually be treated with medicine, and a kidney stone usually will pass through your urine. If neither of these is the cause of your hematuria, further workup to find out the reason may be needed. It is very important that you tell your health care provider about any blood you see in your urine, even if the blood stops without  treatment or happens without causing pain. Blood in your urine that happens and then stops and then happens again can be a symptom of a very serious condition. Also, pain is not a symptom in the initial stages of many urinary cancers.  HOME CARE INSTRUCTIONS   Drink lots of fluid, 3-4 quarts a day. If you have been diagnosed with an infection, cranberry juice is especially recommended, in addition to large amounts of water.  Avoid caffeine, tea, and carbonated beverages because they tend to irritate the bladder.  Avoid alcohol because it may irritate the prostate.  Take all medicines as directed by your health care provider.  If you were prescribed an antibiotic medicine, finish it all even if you start to feel better.  If you have been diagnosed with a kidney stone, follow your health care provider's instructions regarding straining your urine to catch the stone.  Empty your bladder often. Avoid holding urine for long periods of time.  After a bowel movement, women should cleanse front to back. Use each tissue only once.  Empty your bladder before and after sexual intercourse if you are a male. SEEK MEDICAL CARE IF:  You develop back pain.  You have a fever.  You have a feeling of sickness in your stomach (nausea) or vomiting.  Your symptoms are not better in 3 days. Return sooner if you are getting worse. SEEK IMMEDIATE MEDICAL CARE IF:   You develop severe vomiting and are unable to keep the medicine down.  You develop severe back or abdominal pain despite taking your medicines.  You begin passing a large amount of blood or clots in your urine.  You feel extremely weak or faint, or you pass out. MAKE SURE YOU:   Understand these instructions.  Will watch your condition.  Will get help right away if you are not doing well or get worse.   This information is not intended to replace advice given to you by your health care provider. Make sure you discuss any questions  you have with your health care provider.   Document Released: 01/10/2005 Document Revised: 01/31/2014 Document Reviewed: 09/10/2012 Elsevier Interactive Patient Education 2016 [employer].   Adjustment Disorder Adjustment disorder is an unusually severe reaction to a stressful life event, such as the loss of a job or physical illness. The event may be any stressful event other than the loss of a loved one. Adjustment disorder may affect your feelings, your thinking, how you act, or a combination of these. It may interfere with personal relationships or with the way you are at work, school, or home. People with this disorder are at risk for suicide and substance abuse. They may develop a more serious mental disorder, such as major depressive disorder or post-traumatic stress disorder. SIGNS AND SYMPTOMS  Symptoms may include:  Sadness, depressed mood, or crying spells.  Loss of enjoyment.  Change in appetite or weight.  Sense of loss or hopelessness.  Thoughts of suicide.  Anxiety, worry, or nervousness.  Trouble sleeping.  Avoiding family and friends.  Poor school performance.  Fighting or vandalism.  Reckless driving.  Skipping school.  Poor work Systems analyst.  Ignoring bills. Symptoms of adjustment disorder start within 3 months of the stressful life event. They do not last more than 6 months after the event has ended. DIAGNOSIS  To make a diagnosis, your health care provider will ask about what has happened in your life and how it has affected you. He or she may also ask about your medical history and use of medicines, alcohol, and other substances. Your health care provider may do a physical exam and order lab tests or other studies. You may be referred to a mental health specialist for evaluation. TREATMENT  Treatment options  include:  Counseling or talk therapy. Talk therapy is usually provided by mental health specialists.  Medicine. Certain medicines may help  with depression, anxiety, and sleep.  Support groups. Support groups offer emotional support, advice, and guidance. They are made up of people who have had similar experiences. HOME CARE INSTRUCTIONS  Keep all follow-up visits as directed by your health care provider. This is important.  Take medicines only as directed by your health care provider. SEEK MEDICAL CARE IF:  Your symptoms get worse.  SEEK IMMEDIATE MEDICAL CARE IF: You have serious thoughts about hurting yourself or someone else. MAKE SURE YOU:  Understand these instructions.  Will watch your condition.  Will get help right away if you are not doing well or get worse.   This information is not intended to replace advice given to you by your health care provider. Make sure you discuss any questions you have with your health care provider.   Document Released: 09/14/2005 Document Revised: 01/31/2014 Document Reviewed: 06/04/2013 Elsevier Interactive Patient Education [employer].     IF you received an x-ray today, you will receive an invoice from [facility]. Please contact [facility] at [phone] with questions or concerns regarding your invoice.   IF you received labwork today, you will receive an invoice from [employer]. Please contact [facility] at [phone] with questions or concerns regarding your invoice.   Our billing staff will not be able to assist you with questions regarding bills from these companies.  You will be contacted with the lab results as soon as they are available. The fastest way to get your results is to activate your My Chart account. Instructions are located on the last page of this paperwork. If you have not heard from [place] regarding the results in 2 weeks, please contact this office.        I personally performed the services described in this documentation, which was scribed in my presence. The recorded information has been  reviewed and considered, and addended by me as needed.   Signed,   Merri Ray, MD [facility].  08/12/2015 11:30 PM

## 2015-08-12 NOTE — Patient Instructions (Addendum)
Try cutting back to no more than 1-2 alcoholic drinks per day, start walking for exercise and I will provide some resources for counseling. Follow up if you are still feeling down or depressed after increasing exercise. If any suicide thoughts - call 911 or go to the emergency room.  [name]: [id number] Alexander [id number]  Keep a record of your blood pressures outside of the office and if persistently elevated over 140/90 in next 2-3 weeks - let me know so we can change the medication.   Continue 1/2 pill Crestor each day.  When you are ready to quit smoking, let me know if I can help.  [place] offers smoking cessation clinics. Registration is required. To register call [phone] or register online at [email or web address]  Back pain followed by blood in the urine today may have been due to a kidney stone. If you have any return of the pain, or blood in the urine persists in the next day or 2, return to recheck and discuss further. Otherwise we need to recheck your urine test in the next 4-6 weeks to make sure this resolves, especially with history of smoking.   Return to the clinic or go to the nearest emergency room if any of your symptoms worsen or new symptoms occur.   Hematuria, Adult Hematuria is blood in your urine. It can be caused by a bladder infection, kidney infection, prostate infection, kidney stone, or cancer of your urinary tract. Infections can usually be treated with medicine, and a kidney stone usually will pass through your urine. If neither of these is the cause of your hematuria, further workup to find out the reason may be needed. It is very important that you tell your health care provider about any blood you see in your urine, even if the blood stops without treatment or happens without causing pain. Blood in your urine that happens and then stops and then happens again can be a symptom of a very serious condition. Also, pain is not a symptom in the initial  stages of many urinary cancers. HOME CARE INSTRUCTIONS   Drink lots of fluid, 3-4 quarts a day. If you have been diagnosed with an infection, cranberry juice is especially recommended, in addition to large amounts of water.  Avoid caffeine, tea, and carbonated beverages because they tend to irritate the bladder.  Avoid alcohol because it may irritate the prostate.  Take all medicines as directed by your health care provider.  If you were prescribed an antibiotic medicine, finish it all even if you start to feel better.  If you have been diagnosed with a kidney stone, follow your health care provider's instructions regarding straining your urine to catch the stone.  Empty your bladder often. Avoid holding urine for long periods of time.  After a bowel movement, women should cleanse front to back. Use each tissue only once.  Empty your bladder before and after sexual intercourse if you are a male. SEEK MEDICAL CARE IF:  You develop back pain.  You have a fever.  You have a feeling of sickness in your stomach (nausea) or vomiting.  Your symptoms are not better in 3 days. Return sooner if you are getting worse. SEEK IMMEDIATE MEDICAL CARE IF:   You develop severe vomiting and are unable to keep the medicine down.  You develop severe back or abdominal pain despite taking your medicines.  You begin passing a large amount of blood or clots in your urine.  You feel extremely  weak or faint, or you pass out. MAKE SURE YOU:   Understand these instructions.  Will watch your condition.  Will get help right away if you are not doing well or get worse.   This information is not intended to replace advice given to you by your health care provider. Make sure you discuss any questions you have with your health care provider.   Document Released: 01/10/2005 Document Revised: 01/31/2014 Document Reviewed: 09/10/2012 Elsevier Interactive Patient Education 2016 [employer].   Adjustment Disorder Adjustment disorder is an unusually severe reaction to a stressful life event, such as the loss of a job or physical illness. The event may be any stressful event other than the loss of a loved one. Adjustment disorder may affect your feelings, your thinking, how you act, or a combination of these. It may interfere with personal relationships or with the way you are at work, school, or home. People with this disorder are at risk for suicide and substance abuse. They may develop a more serious mental disorder, such as major depressive disorder or post-traumatic stress disorder. SIGNS AND SYMPTOMS  Symptoms may include:  Sadness, depressed mood, or crying spells.  Loss of enjoyment.  Change in appetite or weight.  Sense of loss or hopelessness.  Thoughts of suicide.  Anxiety, worry, or nervousness.  Trouble sleeping.  Avoiding family and friends.  Poor school performance.  Fighting or vandalism.  Reckless driving.  Skipping school.  Poor work Systems analyst.  Ignoring bills. Symptoms of adjustment disorder start within 3 months of the stressful life event. They do not last more than 6 months after the event has ended. DIAGNOSIS  To make a diagnosis, your health care provider will ask about what has happened in your life and how it has affected you. He or she may also ask about your medical history and use of medicines, alcohol, and other substances. Your health care provider may do a physical exam and order lab tests or other studies. You may be referred to a mental health specialist for evaluation. TREATMENT  Treatment options include:  Counseling or talk therapy. Talk therapy is usually provided by mental health specialists.  Medicine. Certain medicines may help with depression, anxiety, and sleep.  Support groups. Support groups offer emotional support, advice, and guidance. They are made up of people who have had similar experiences. HOME CARE  INSTRUCTIONS  Keep all follow-up visits as directed by your health care provider. This is important.  Take medicines only as directed by your health care provider. SEEK MEDICAL CARE IF:  Your symptoms get worse.  SEEK IMMEDIATE MEDICAL CARE IF: You have serious thoughts about hurting yourself or someone else. MAKE SURE YOU:  Understand these instructions.  Will watch your condition.  Will get help right away if you are not doing well or get worse.   This information is not intended to replace advice given to you by your health care provider. Make sure you discuss any questions you have with your health care provider.   Document Released: 09/14/2005 Document Revised: 01/31/2014 Document Reviewed: 06/04/2013 Elsevier Interactive Patient Education [employer].     IF you received an x-ray today, you will receive an invoice from [facility]. Please contact [facility] at [phone] with questions or concerns regarding your invoice.   IF you received labwork today, you will receive an invoice from [employer]. Please contact [facility] at [phone] with questions or concerns regarding your invoice.   Our billing staff  will not be able to assist you with questions regarding bills from these companies.  You will be contacted with the lab results as soon as they are available. The fastest way to get your results is to activate your My Chart account. Instructions are located on the last page of this paperwork. If you have not heard from [place] regarding the results in 2 weeks, please contact this office.

## 2015-08-21 ENCOUNTER — Other Ambulatory Visit (INDEPENDENT_AMBULATORY_CARE_PROVIDER_SITE_OTHER): Payer: Self-pay | Admitting: Family Medicine

## 2015-08-21 DIAGNOSIS — E785 Hyperlipidemia, unspecified: Secondary | ICD-10-CM

## 2015-08-21 DIAGNOSIS — I10 Essential (primary) hypertension: Secondary | ICD-10-CM

## 2015-08-21 LAB — COMPLETE METABOLIC PANEL WITH GFR
ALT: 54 U/L — AB (ref 9–46)
AST: 31 U/L (ref 10–35)
Albumin: 4.2 g/dL (ref 3.6–5.1)
Alkaline Phosphatase: 90 U/L (ref 40–115)
BUN: 13 mg/dL (ref 7–25)
CALCIUM: 9.5 mg/dL (ref 8.6–10.3)
CHLORIDE: 102 mmol/L (ref 98–110)
CO2: 28 mmol/L (ref 20–31)
CREATININE: 1.01 mg/dL (ref 0.70–1.33)
GFR, Est African American: >89 mL/min (ref >=60)
GFR, Est Non African American: 83 mL/min (ref >=60)
Glucose, Bld: 88 mg/dL (ref 65–99)
POTASSIUM: 4.7 mmol/L (ref 3.5–5.3)
SODIUM: 138 mmol/L (ref 135–146)
Total Bilirubin: 0.9 mg/dL (ref 0.2–1.2)
Total Protein: 6.7 g/dL (ref 6.1–8.1)

## 2015-08-21 LAB — LIPID PANEL
CHOL/HDL RATIO: 5.8 ratio — AB (ref <=5.0)
CHOLESTEROL: 196 mg/dL (ref 125–200)
HDL: 34 mg/dL — ABNORMAL LOW (ref >=40)
LDL CALC: 123 mg/dL (ref <130)
Triglycerides: 194 mg/dL — ABNORMAL HIGH (ref <150)
VLDL: 39 mg/dL — AB (ref <30)

## 2015-11-25 ENCOUNTER — Encounter: Payer: Self-pay | Admitting: Family Medicine

## 2016-04-14 ENCOUNTER — Encounter: Payer: Self-pay | Admitting: Family Medicine

## 2016-04-14 ENCOUNTER — Ambulatory Visit (INDEPENDENT_AMBULATORY_CARE_PROVIDER_SITE_OTHER): Payer: Self-pay | Admitting: Family Medicine

## 2016-04-14 ENCOUNTER — Ambulatory Visit (INDEPENDENT_AMBULATORY_CARE_PROVIDER_SITE_OTHER): Payer: Self-pay

## 2016-04-14 VITALS — BP 134/83 | HR 76 | Temp 97.7°F | Resp 16 | Ht 64.0 in | Wt 143.8 lb

## 2016-04-14 DIAGNOSIS — R0789 Other chest pain: Secondary | ICD-10-CM

## 2016-04-14 DIAGNOSIS — R0609 Other forms of dyspnea: Secondary | ICD-10-CM

## 2016-04-14 DIAGNOSIS — E785 Hyperlipidemia, unspecified: Secondary | ICD-10-CM

## 2016-04-14 DIAGNOSIS — I10 Essential (primary) hypertension: Secondary | ICD-10-CM

## 2016-04-14 DIAGNOSIS — R972 Elevated prostate specific antigen [PSA]: Secondary | ICD-10-CM

## 2016-04-14 DIAGNOSIS — R9431 Abnormal electrocardiogram [ECG] [EKG]: Secondary | ICD-10-CM

## 2016-04-14 DIAGNOSIS — Z720 Tobacco use: Secondary | ICD-10-CM

## 2016-04-14 MED ORDER — HYDROCHLOROTHIAZIDE 12.5 MG PO CAPS: 12.5000 mg | ORAL_CAPSULE | Freq: Every day | ORAL | 1 refills | Status: AC

## 2016-04-14 MED ORDER — ROSUVASTATIN CALCIUM 10 MG PO TABS: ORAL_TABLET | ORAL | 1 refills | Status: DC

## 2016-04-14 MED ORDER — LISINOPRIL 5 MG PO TABS: 5.0000 mg | ORAL_TABLET | Freq: Every day | ORAL | 1 refills | Status: AC

## 2016-04-14 NOTE — Progress Notes (Addendum)
By signing my name below, I, Mesha Guinyard, attest that this documentation has been prepared under the direction and in the presence of Merri Ray, MD.  Electronically Signed: Verlee Monte, Medical Scribe. 04/14/16. 4:01 PM.  Subjective:    Patient ID: [name], male    DOB: Jan 13, 1961, 56 y.o.   MRN: [id number]  HPI Chief Complaint  Patient presents with  . Medication Refill    crestor, HCTZ, lisinopril    HPI Comments: [name] is a 56 y.o. male who presents to the Primary Care at [place] complaining of medication refill of crestor, HCTZ, and lisinopril.  HTN: Takes HCTZ 12.5 mg QD, and lisinopril 5 mg QD. Pt has been out of his medication for a week and has been cutting his dose in half to make it stretch since. Notes his bp machine has broke but his home bp is similar to office readings. Lab Results  Component Value Date   CREATININE 1.01 08/21/2015   BP Readings from Last 3 Encounters:  04/14/16 134/83  08/12/15 (!) 158/100  03/26/14 136/82   HLD: Takes crestor 5 mg QD. Pt takes his medication daily and only misses a single dose a week. Pt ran out of medication last week and has been cutting his dose in half. Pt has been eating more junk food (canned food included) due to financial concerns and hasn't been working out.  Lab Results  Component Value Date   CHOL 196 08/21/2015   HDL 34 (L) 08/21/2015   LDLCALC 123 08/21/2015   TRIG 194 (H) 08/21/2015   CHOLHDL 5.8 (H) 08/21/2015   Lab Results  Component Value Date   ALT 54 (H) 08/21/2015   AST 31 08/21/2015   ALKPHOS 90 08/21/2015   BILITOT 0.9 08/21/2015   Tobacco Abuse: Discussed cessation in the past. Currently a smoker. Smokes 2.5 ppd and he's not ready for cessation. Pt admits to drinking every other day and when he does it's over a period of time. Denies DUIs, issues at work from alcohol, drinking until drunk.  Increased PSA Velocity: See notes in 2016. He  was referred to urology. His PSA had increased from 1.12 to 3.33 when checked Mach 3rd 2016. Pt saw a urologist and was told it was from recent intercourse. He has not had it rechecked or have been back to the urologist since. Reports dribble after urination for the past few years without changes to his sxs. Pt has a nocturia episode after 6 hours into sleeping.  Chest discomfort: Notes coughing up sputum with associated sxs of SOB that feels similar to the chest discomfort that's felt after running, as well as hearing popping noise in his chest when he lays down after smoking. Reports feeling SOB 1-2x a week for the past year after breathing the cold air when walking to the gas station in the morning and while in the shower. Reports his last stress text was in 2012. Denies chest pain, chest tightness, DOE, calf swelling, syncope, or difficulty breathing. Denies FHx of heart attack/heart disease. grandfather had a stroke  Pt is not working at the time and is tight with finances.   Patient Active Problem List   Diagnosis Date Noted  . Abnormal PSA-rate of rise 03/27/2014  . Tobacco abuse 05/27/2013  . Hyperlipidemia 05/27/2011  . HTN (hypertension) 05/27/2011   Past Medical History:  Diagnosis Date  . Anxiety   . Cataract   . Depression   . GERD (gastroesophageal  reflux disease)   . Hyperlipidemia   . Hypertension    No past surgical history on file. No Known Allergies Prior to Admission medications   Medication Sig Start Date End Date Taking? Authorizing Provider  aspirin 81 MG tablet Take 81 mg by mouth daily.   Yes Historical Provider, MD  fish oil-omega-3 fatty acids 1000 MG capsule Take 2 g by mouth daily.   Yes Historical Provider, MD  hydrochlorothiazide (MICROZIDE) 12.5 MG capsule Take 1 capsule (12.5 mg total) by mouth daily. 08/12/15  Yes Wendie Agreste, MD  lisinopril (PRINIVIL,ZESTRIL) 5 MG tablet Take 1 tablet (5 mg total) by mouth daily. 08/12/15  Yes Wendie Agreste, MD    Multiple Vitamin (MULTIVITAMIN) tablet Take 1 tablet by mouth daily.   Yes Historical Provider, MD  ranitidine (ZANTAC) 75 MG tablet Take 1 tablet (75 mg total) by mouth daily. 10/08/12  Yes Wendie Agreste, MD  rosuvastatin (CRESTOR) 10 MG tablet TAKE 1/2 TABLET(5MG ) BY MOUTH DAILY 08/12/15  Yes Wendie Agreste, MD   Social History   Social History  . Marital status: Single    Spouse name: N/A  . Number of children: N/A  . Years of education: N/A   Occupational History  . Not on file.   Social History Main Topics  . Smoking status: Current Every Day Smoker  . Smokeless tobacco: Never Used  . Alcohol use No  . Drug use: No  . Sexual activity: Not on file   Other Topics Concern  . Not on file   Social History Narrative  . No narrative on file   Review of Systems  Respiratory: Positive for cough and shortness of breath. Negative for chest tightness.   Cardiovascular: Negative for chest pain and leg swelling.  Neurological: Negative for syncope.   Objective:  Physical Exam  Constitutional: He appears well-developed and well-nourished. No distress.  HENT:  Head: Normocephalic and atraumatic.  Eyes: Conjunctivae are normal.  Neck: Neck supple.  Cardiovascular: Normal rate, regular rhythm and normal heart sounds.  Exam reveals no gallop and no friction rub.   No murmur heard. Pulmonary/Chest: Effort normal and breath sounds normal. No respiratory distress. He has no wheezes. He has no rales. He exhibits no tenderness.  Neurological: He is alert.  Skin: Skin is warm and dry.  Psychiatric: He has a normal mood and affect. His behavior is normal.  Nursing note and vitals reviewed.   Vitals:   04/14/16 1522  BP: 134/83  Pulse: 76  Resp: 16  Temp: 97.7 F (36.5 C)  TempSrc: Oral  SpO2: 95%  Weight: 143 lb 12.8 oz (65.2 kg)  Height: 5\' 4"  (1.626 m)  Body mass index is 24.68 kg/m.   EKG:  Sinu rhythm, rate 68, possible signs of inferior infarct with Q in II. III,  AVF, and TWI V5-6 not seen on prior EKG 06/09/10  Assessment & Plan:    [name] is a 56 y.o. male Increased prostate specific antigen (PSA) velocity - Plan: PSA  - has not had repeated since urology OV. Recheck PSA.   Essential hypertension - Plan: Comprehensive metabolic panel, hydrochlorothiazide (MICROZIDE) 12.5 MG capsule, lisinopril (PRINIVIL,ZESTRIL) 5 MG tablet  - stable, although med nanadherent as ran out. Tolerated doses on meds.  continue same dose HCTZ and lisinopril.   Hyperlipidemia, unspecified hyperlipidemia type - Plan: Comprehensive metabolic panel, Lipid panel, rosuvastatin (CRESTOR) 10 MG tablet  - check lipids, but may be off with few missed doses. Anticipate  10mg  dose may be needed. crestor refilled.   Tobacco abuse  - cessation again discussed and potential risks. Not ready to quit yet, but resources discussed/handout given.   DOE (dyspnea on exertion) - Plan: EKG 12-Lead, DG Chest 2 View Chest discomfort - Plan: EKG 12-Lead, DG Chest 2 View  - multiple CVD risk factors including tobacco abuse, age, HTN, hyperlipidemia with possible angina/anginal equivalent with dyspnea and chest sensation. EKG suspicious for inferior wall event - unknown timing. Asymptomatic in office.   - avoid exertional activities for now, restart antihypertensives and continue ASA QD.    -will discuss with cardiology, but at minimum will need to be evaluated in their office.   -ER/911 chest pain precautions reviewed.     4:13 PM Friday 04/15/16 Spoke with cardiology. Will be called by cardiology to schedule echo then OV next week. Rx provided for NTG if needed, with 911/ER precautions.    Meds ordered this encounter  Medications  . hydrochlorothiazide (MICROZIDE) 12.5 MG capsule    Sig: Take 1 capsule (12.5 mg total) by mouth daily.    Dispense:  90 capsule    Refill:  1  . lisinopril (PRINIVIL,ZESTRIL) 5 MG tablet    Sig: Take 1 tablet (5 mg total) by mouth daily.     Dispense:  90 tablet    Refill:  1  . rosuvastatin (CRESTOR) 10 MG tablet    Sig: TAKE 1/2 TABLET(5MG ) BY MOUTH DAILY    Dispense:  45 tablet    Refill:  1   Patient Instructions     When you are ready to quit smoking, let me know and I will be happy to help.  See other information below.   Limit alcohol to no more than 2 drinks per day.  If you have difficulty cutting back, let me know.   I will check a prostate level again as it was recommended to recheck in 2016 to make sure it has improved or stable.  No change in medications for now,I will let you know I have the lab work back.  I will check an EKG and chest x-ray for your lungs/chest symptoms, but would recommend stress testing with cardiology or at least an evaluation with cardiology depending on those results. If you do have any return of chest pain, or that pain worsens, call 911 or go to the emergency room for evaluation.  [facility] offers smoking cessation clinics. Registration is required. To register call [phone] or register online at [email or web address]   Shortness of Breath, Adult Shortness of breath means you have trouble breathing. Your lungs are organs for breathing. Follow these instructions at home: Pay attention to any changes in your symptoms. Take these actions to help with your condition:  Do not smoke. Smoking can cause shortness of breath. If you need help to quit smoking, ask your doctor.  Avoid things that can make it harder to breathe, such as:  Mold.  Dust.  Air pollution.  Chemical smells.  Things that can cause allergy symptoms (allergens), if you have allergies.  Keep your living space clean and free of mold and dust.  Rest as needed. Slowly return to your usual activities.  Take over-the-counter and prescription medicines, including oxygen and inhaled medicines, only as told by your doctor.  Keep all follow-up visits as told by your doctor. This is important. Contact a doctor  if:  Your condition does not get better as soon as expected.  You have a hard time doing your  normal activities, even after you rest.  You have new symptoms. Get help right away if:  You have trouble breathing when you are resting.  You feel light-headed or you faint.  You have a cough that is not helped by medicines.  You cough up blood.  You have pain with breathing.  You have pain in your chest, arms, shoulders, or belly (abdomen).  You have a fever.  You cannot walk up stairs.  You cannot exercise the way you normally do. This information is not intended to replace advice given to you by your health care provider. Make sure you discuss any questions you have with your health care provider. Document Released: 06/29/2007 Document Revised: 01/28/2016 Document Reviewed: 01/28/2016 Elsevier Interactive Patient Education  2017 Elsevier Inc.  Nonspecific Chest Pain Chest pain can be caused by many different conditions. There is always a chance that your pain could be related to something serious, such as a heart attack or a blood clot in your lungs. Chest pain can also be caused by conditions that are not life-threatening. If you have chest pain, it is very important to follow up with your health care provider. What are the causes? Causes of this condition include:  Heartburn.  Pneumonia or bronchitis.  Anxiety or stress.  Inflammation around your heart (pericarditis) or lung (pleuritis or pleurisy).  A blood clot in your lung.  A collapsed lung (pneumothorax). This can develop suddenly on its own (spontaneous pneumothorax) or from trauma to the chest.  Shingles infection (varicella-zoster virus).  Heart attack.  Damage to the bones, muscles, and cartilage that make up your chest wall. This can include:  Bruised bones due to injury.  Strained muscles or cartilage due to frequent or repeated coughing or overwork.  Fracture to one or more ribs.  Sore cartilage  due to inflammation (costochondritis). What increases the risk? Risk factors for this condition may include:  Activities that increase your risk for trauma or injury to your chest.  Respiratory infections or conditions that cause frequent coughing.  Medical conditions or overeating that can cause heartburn.  Heart disease or family history of heart disease.  Conditions or health behaviors that increase your risk of developing a blood clot.  Having had chicken pox (varicella zoster). What are the signs or symptoms? Chest pain can feel like:  Burning or tingling on the surface of your chest or deep in your chest.  Crushing, pressure, aching, or squeezing pain.  Dull or sharp pain that is worse when you move, cough, or take a deep breath.  Pain that is also felt in your back, neck, shoulder, or arm, or pain that spreads to any of these areas. Your chest pain may come and go, or it may stay constant. How is this diagnosed? Lab tests or other studies may be needed to find the cause of your pain. Your health care provider may have you take a test called an ECG (electrocardiogram). An ECG records your heartbeat patterns at the time the test is performed. You may also have other tests, such as:  Transthoracic echocardiogram (TTE). In this test, sound waves are used to create a picture of the heart structures and to look at how blood flows through your heart.  Transesophageal echocardiogram (TEE).This is a more advanced imaging test that takes images from inside your body. It allows your health care provider to see your heart in finer detail.  Cardiac monitoring. This allows your health care provider to monitor your heart rate and  rhythm in real time.  Holter monitor. This is a portable device that records your heartbeat and can help to diagnose abnormal heartbeats. It allows your health care provider to track your heart activity for several days, if needed.  Stress tests. These can be  done through exercise or by taking medicine that makes your heart beat more quickly.  Blood tests.  Other imaging tests. How is this treated? Treatment depends on what is causing your chest pain. Treatment may include:  Medicines. These may include:  Acid blockers for heartburn.  Anti-inflammatory medicine.  Pain medicine for inflammatory conditions.  Antibiotic medicine, if an infection is present.  Medicines to dissolve blood clots.  Medicines to treat coronary artery disease (CAD).  Supportive care for conditions that do not require medicines. This may include:  Resting.  Applying heat or cold packs to injured areas.  Limiting activities until pain decreases. Follow these instructions at home: Medicines   If you were prescribed an antibiotic, take it as told by your health care provider. Do not stop taking the antibiotic even if you start to feel better.  Take over-the-counter and prescription medicines only as told by your health care provider. Lifestyle   Do not use any products that contain nicotine or tobacco, such as cigarettes and e-cigarettes. If you need help quitting, ask your health care provider.  Do not drink alcohol.  Make lifestyle changes as directed by your health care provider. These may include:  Getting regular exercise. Ask your health care provider to suggest some activities that are safe for you.  Eating a heart-healthy diet. A registered dietitian can help you to learn healthy eating options.  Maintaining a healthy weight.  Managing diabetes, if necessary.  Reducing stress, such as with yoga or relaxation techniques. General instructions   Avoid any activities that bring on chest pain.  If heartburn is the cause for your chest pain, raise (elevate) the head of your bed about 6 inches (15 cm) by putting blocks under the legs. Sleeping with more pillows does not effectively relieve heartburn because it only changes the position of your  head.  Keep all follow-up visits as told by your health care provider. This is important. This includes any further testing if your chest pain does not go away. Contact a health care provider if:  Your chest pain does not go away.  You have a rash with blisters on your chest.  You have a fever.  You have chills. Get help right away if:  Your chest pain is worse.  You have a cough that gets worse, or you cough up blood.  You have severe pain in your abdomen.  You have severe weakness.  You faint.  You have sudden, unexplained chest discomfort.  You have sudden, unexplained discomfort in your arms, back, neck, or jaw.  You have shortness of breath at any time.  You suddenly start to sweat, or your skin gets clammy.  You feel nauseous or you vomit.  You suddenly feel light-headed or dizzy.  Your heart begins to beat quickly, or it feels like it is skipping beats. These symptoms may represent a serious problem that is an emergency. Do not wait to see if the symptoms will go away. Get medical help right away. Call your local emergency services (911 in the [place] Do not drive yourself to the hospital. This information is not intended to replace advice given to you by your health care provider. Make sure you discuss any questions you  have with your health care provider. Document Released: 10/20/2004 Document Revised: 10/05/2015 Document Reviewed: 10/05/2015 Elsevier Interactive Patient Education  2017 [employer].   Steps to Quit Smoking Smoking tobacco can be harmful to your health and can affect almost every organ in your body. Smoking puts you, and those around you, at risk for developing many serious chronic diseases. Quitting smoking is difficult, but it is one of the best things that you can do for your health. It is never too late to quit. What are the benefits of quitting smoking? When you quit smoking, you lower your risk of developing serious diseases and  conditions, such as:  Lung cancer or lung disease, such as COPD.  Heart disease.  Stroke.  Heart attack.  Infertility.  Osteoporosis and bone fractures. Additionally, symptoms such as coughing, wheezing, and shortness of breath may get better when you quit. You may also find that you get sick less often because your body is stronger at fighting off colds and infections. If you are pregnant, quitting smoking can help to reduce your chances of having a baby of low birth weight. How do I get ready to quit? When you decide to quit smoking, create a plan to make sure that you are successful. Before you quit:  Pick a date to quit. Set a date within the next two weeks to give you time to prepare.  Write down the reasons why you are quitting. Keep this list in places where you will see it often, such as on your bathroom mirror or in your car or wallet.  Identify the people, places, things, and activities that make you want to smoke (triggers) and avoid them. Make sure to take these actions:  Throw away all cigarettes at home, at work, and in your car.  Throw away smoking accessories, such as Scientist, research (medical).  Clean your car and make sure to empty the ashtray.  Clean your home, including curtains and carpets.  Tell your family, friends, and coworkers that you are quitting. Support from your loved ones can make quitting easier.  Talk with your health care provider about your options for quitting smoking.  Find out what treatment options are covered by your health insurance. What strategies can I use to quit smoking? Talk with your healthcare provider about different strategies to quit smoking. Some strategies include:  Quitting smoking altogether instead of gradually lessening how much you smoke over a period of time. Research shows that quitting "cold [place]" is more successful than gradually quitting.  Attending in-person counseling to help you build problem-solving skills. You  are more likely to have success in quitting if you attend several counseling sessions. Even short sessions of 10 minutes can be effective.  Finding resources and support systems that can help you to quit smoking and remain smoke-free after you quit. These resources are most helpful when you use them often. They can include:  Online chats with a Social worker.  Telephone quitlines.  Printed Furniture conservator/restorer.  Support groups or group counseling.  Text messaging programs.  Mobile phone applications.  Taking medicines to help you quit smoking. (If you are pregnant or breastfeeding, talk with your health care provider first.) Some medicines contain nicotine and some do not. Both types of medicines help with cravings, but the medicines that include nicotine help to relieve withdrawal symptoms. Your health care provider may recommend:  Nicotine patches, gum, or lozenges.  Nicotine inhalers or sprays.  Non-nicotine medicine that is taken by mouth. Talk with  your health care provider about combining strategies, such as taking medicines while you are also receiving in-person counseling. Using these two strategies together makes you more likely to succeed in quitting than if you used either strategy on its own. If you are pregnant or breastfeeding, talk with your health care provider about finding counseling or other support strategies to quit smoking. Do not take medicine to help you quit smoking unless told to do so by your health care provider. What things can I do to make it easier to quit? Quitting smoking might feel overwhelming at first, but there is a lot that you can do to make it easier. Take these important actions:  Reach out to your family and friends and ask that they support and encourage you during this time. Call telephone quitlines, reach out to support groups, or work with a counselor for support.  Ask people who smoke to avoid smoking around you.  Avoid places that trigger you  to smoke, such as bars, parties, or smoke-break areas at work.  Spend time around people who do not smoke.  Lessen stress in your life, because stress can be a smoking trigger for some people. To lessen stress, try:  Exercising regularly.  Deep-breathing exercises.  Yoga.  Meditating.  Performing a body scan. This involves closing your eyes, scanning your body from head to toe, and noticing which parts of your body are particularly tense. Purposefully relax the muscles in those areas.  Download or purchase mobile phone or tablet apps (applications) that can help you stick to your quit plan by providing reminders, tips, and encouragement. There are many free apps, such as QuitGuide from the [employer] Office manager for Disease Control and Prevention). You can find other support for quitting smoking (smoking cessation) through smokefree.gov and other websites. How will I feel when I quit smoking? Within the first 24 hours of quitting smoking, you may start to feel some withdrawal symptoms. These symptoms are usually most noticeable 2-3 days after quitting, but they usually do not last beyond 2-3 weeks. Changes or symptoms that you might experience include:  Mood swings.  Restlessness, anxiety, or irritation.  Difficulty concentrating.  Dizziness.  Strong cravings for sugary foods in addition to nicotine.  Mild weight gain.  Constipation.  Nausea.  Coughing or a sore throat.  Changes in how your medicines work in your body.  A depressed mood.  Difficulty sleeping (insomnia). After the first 2-3 weeks of quitting, you may start to notice more positive results, such as:  Improved sense of smell and taste.  Decreased coughing and sore throat.  Slower heart rate.  Lower blood pressure.  Clearer skin.  The ability to breathe more easily.  Fewer sick days. Quitting smoking is very challenging for most people. Do not get discouraged if you are not successful the first time. Some  people need to make many attempts to quit before they achieve long-term success. Do your best to stick to your quit plan, and talk with your health care provider if you have any questions or concerns. This information is not intended to replace advice given to you by your health care provider. Make sure you discuss any questions you have with your health care provider. Document Released: 01/04/2001 Document Revised: 09/08/2015 Document Reviewed: 05/27/2014 Elsevier Interactive Patient Education  2017 [employer].   IF you received an x-ray today, you will receive an invoice from [facility]. Please contact [facility] at [phone] with questions or concerns regarding your invoice.  IF you received labwork today, you will receive an invoice from [facility]. Please contact [facility] at [phone] with questions or concerns regarding your invoice.   Our billing staff will not be able to assist you with questions regarding bills from these companies.  You will be contacted with the lab results as soon as they are available. The fastest way to get your results is to activate your My Chart account. Instructions are located on the last page of this paperwork. If you have not heard from [place] regarding the results in 2 weeks, please contact this office.

## 2016-04-14 NOTE — Patient Instructions (Addendum)
When you are ready to quit smoking, let me know and I will be happy to help.  See other information below.   Limit alcohol to no more than 2 drinks per day.  If you have difficulty cutting back, let me know.   I will check a prostate level again as it was recommended to recheck in 2016 to make sure it has improved or stable.  No change in medications for now,I will let you know I have the lab work back.  I will check an EKG and chest x-ray for your lungs/chest symptoms, but would recommend stress testing with cardiology or at least an evaluation with cardiology depending on those results. If you do have any return of chest pain, or that pain worsens, call 911 or go to the emergency room for evaluation.  [facility] offers smoking cessation clinics. Registration is required. To register call [phone] or register online at [email or web address]   Shortness of Breath, Adult Shortness of breath means you have trouble breathing. Your lungs are organs for breathing. Follow these instructions at home: Pay attention to any changes in your symptoms. Take these actions to help with your condition:  Do not smoke. Smoking can cause shortness of breath. If you need help to quit smoking, ask your doctor.  Avoid things that can make it harder to breathe, such as:  Mold.  Dust.  Air pollution.  Chemical smells.  Things that can cause allergy symptoms (allergens), if you have allergies.  Keep your living space clean and free of mold and dust.  Rest as needed. Slowly return to your usual activities.  Take over-the-counter and prescription medicines, including oxygen and inhaled medicines, only as told by your doctor.  Keep all follow-up visits as told by your doctor. This is important. Contact a doctor if:  Your condition does not get better as soon as expected.  You have a hard time doing your normal activities, even after you rest.  You have new symptoms. Get help right away  if:  You have trouble breathing when you are resting.  You feel light-headed or you faint.  You have a cough that is not helped by medicines.  You cough up blood.  You have pain with breathing.  You have pain in your chest, arms, shoulders, or belly (abdomen).  You have a fever.  You cannot walk up stairs.  You cannot exercise the way you normally do. This information is not intended to replace advice given to you by your health care provider. Make sure you discuss any questions you have with your health care provider. Document Released: 06/29/2007 Document Revised: 01/28/2016 Document Reviewed: 01/28/2016 Elsevier Interactive Patient Education  2017 Elsevier Inc.  Nonspecific Chest Pain Chest pain can be caused by many different conditions. There is always a chance that your pain could be related to something serious, such as a heart attack or a blood clot in your lungs. Chest pain can also be caused by conditions that are not life-threatening. If you have chest pain, it is very important to follow up with your health care provider. What are the causes? Causes of this condition include:  Heartburn.  Pneumonia or bronchitis.  Anxiety or stress.  Inflammation around your heart (pericarditis) or lung (pleuritis or pleurisy).  A blood clot in your lung.  A collapsed lung (pneumothorax). This can develop suddenly on its own (spontaneous pneumothorax) or from trauma to the chest.  Shingles infection (varicella-zoster virus).  Heart attack.  Damage to the  bones, muscles, and cartilage that make up your chest wall. This can include:  Bruised bones due to injury.  Strained muscles or cartilage due to frequent or repeated coughing or overwork.  Fracture to one or more ribs.  Sore cartilage due to inflammation (costochondritis). What increases the risk? Risk factors for this condition may include:  Activities that increase your risk for trauma or injury to your  chest.  Respiratory infections or conditions that cause frequent coughing.  Medical conditions or overeating that can cause heartburn.  Heart disease or family history of heart disease.  Conditions or health behaviors that increase your risk of developing a blood clot.  Having had chicken pox (varicella zoster). What are the signs or symptoms? Chest pain can feel like:  Burning or tingling on the surface of your chest or deep in your chest.  Crushing, pressure, aching, or squeezing pain.  Dull or sharp pain that is worse when you move, cough, or take a deep breath.  Pain that is also felt in your back, neck, shoulder, or arm, or pain that spreads to any of these areas. Your chest pain may come and go, or it may stay constant. How is this diagnosed? Lab tests or other studies may be needed to find the cause of your pain. Your health care provider may have you take a test called an ECG (electrocardiogram). An ECG records your heartbeat patterns at the time the test is performed. You may also have other tests, such as:  Transthoracic echocardiogram (TTE). In this test, sound waves are used to create a picture of the heart structures and to look at how blood flows through your heart.  Transesophageal echocardiogram (TEE).This is a more advanced imaging test that takes images from inside your body. It allows your health care provider to see your heart in finer detail.  Cardiac monitoring. This allows your health care provider to monitor your heart rate and rhythm in real time.  Holter monitor. This is a portable device that records your heartbeat and can help to diagnose abnormal heartbeats. It allows your health care provider to track your heart activity for several days, if needed.  Stress tests. These can be done through exercise or by taking medicine that makes your heart beat more quickly.  Blood tests.  Other imaging tests. How is this treated? Treatment depends on what is  causing your chest pain. Treatment may include:  Medicines. These may include:  Acid blockers for heartburn.  Anti-inflammatory medicine.  Pain medicine for inflammatory conditions.  Antibiotic medicine, if an infection is present.  Medicines to dissolve blood clots.  Medicines to treat coronary artery disease (CAD).  Supportive care for conditions that do not require medicines. This may include:  Resting.  Applying heat or cold packs to injured areas.  Limiting activities until pain decreases. Follow these instructions at home: Medicines   If you were prescribed an antibiotic, take it as told by your health care provider. Do not stop taking the antibiotic even if you start to feel better.  Take over-the-counter and prescription medicines only as told by your health care provider. Lifestyle   Do not use any products that contain nicotine or tobacco, such as cigarettes and e-cigarettes. If you need help quitting, ask your health care provider.  Do not drink alcohol.  Make lifestyle changes as directed by your health care provider. These may include:  Getting regular exercise. Ask your health care provider to suggest some activities that are safe for you.  Eating a heart-healthy diet. A registered dietitian can help you to learn healthy eating options.  Maintaining a healthy weight.  Managing diabetes, if necessary.  Reducing stress, such as with yoga or relaxation techniques. General instructions   Avoid any activities that bring on chest pain.  If heartburn is the cause for your chest pain, raise (elevate) the head of your bed about 6 inches (15 cm) by putting blocks under the legs. Sleeping with more pillows does not effectively relieve heartburn because it only changes the position of your head.  Keep all follow-up visits as told by your health care provider. This is important. This includes any further testing if your chest pain does not go away. Contact a  health care provider if:  Your chest pain does not go away.  You have a rash with blisters on your chest.  You have a fever.  You have chills. Get help right away if:  Your chest pain is worse.  You have a cough that gets worse, or you cough up blood.  You have severe pain in your abdomen.  You have severe weakness.  You faint.  You have sudden, unexplained chest discomfort.  You have sudden, unexplained discomfort in your arms, back, neck, or jaw.  You have shortness of breath at any time.  You suddenly start to sweat, or your skin gets clammy.  You feel nauseous or you vomit.  You suddenly feel light-headed or dizzy.  Your heart begins to beat quickly, or it feels like it is skipping beats. These symptoms may represent a serious problem that is an emergency. Do not wait to see if the symptoms will go away. Get medical help right away. Call your local emergency services (911 in the [place] Do not drive yourself to the hospital. This information is not intended to replace advice given to you by your health care provider. Make sure you discuss any questions you have with your health care provider. Document Released: 10/20/2004 Document Revised: 10/05/2015 Document Reviewed: 10/05/2015 Elsevier Interactive Patient Education  2017 [employer].   Steps to Quit Smoking Smoking tobacco can be harmful to your health and can affect almost every organ in your body. Smoking puts you, and those around you, at risk for developing many serious chronic diseases. Quitting smoking is difficult, but it is one of the best things that you can do for your health. It is never too late to quit. What are the benefits of quitting smoking? When you quit smoking, you lower your risk of developing serious diseases and conditions, such as:  Lung cancer or lung disease, such as COPD.  Heart disease.  Stroke.  Heart attack.  Infertility.  Osteoporosis and bone fractures. Additionally,  symptoms such as coughing, wheezing, and shortness of breath may get better when you quit. You may also find that you get sick less often because your body is stronger at fighting off colds and infections. If you are pregnant, quitting smoking can help to reduce your chances of having a baby of low birth weight. How do I get ready to quit? When you decide to quit smoking, create a plan to make sure that you are successful. Before you quit:  Pick a date to quit. Set a date within the next two weeks to give you time to prepare.  Write down the reasons why you are quitting. Keep this list in places where you will see it often, such as on your bathroom mirror or in your car or wallet.  Identify the people, places, things, and activities that make you want to smoke (triggers) and avoid them. Make sure to take these actions:  Throw away all cigarettes at home, at work, and in your car.  Throw away smoking accessories, such as Scientist, research (medical).  Clean your car and make sure to empty the ashtray.  Clean your home, including curtains and carpets.  Tell your family, friends, and coworkers that you are quitting. Support from your loved ones can make quitting easier.  Talk with your health care provider about your options for quitting smoking.  Find out what treatment options are covered by your health insurance. What strategies can I use to quit smoking? Talk with your healthcare provider about different strategies to quit smoking. Some strategies include:  Quitting smoking altogether instead of gradually lessening how much you smoke over a period of time. Research shows that quitting "cold [place]" is more successful than gradually quitting.  Attending in-person counseling to help you build problem-solving skills. You are more likely to have success in quitting if you attend several counseling sessions. Even short sessions of 10 minutes can be effective.  Finding resources and support systems  that can help you to quit smoking and remain smoke-free after you quit. These resources are most helpful when you use them often. They can include:  Online chats with a Social worker.  Telephone quitlines.  Printed Furniture conservator/restorer.  Support groups or group counseling.  Text messaging programs.  Mobile phone applications.  Taking medicines to help you quit smoking. (If you are pregnant or breastfeeding, talk with your health care provider first.) Some medicines contain nicotine and some do not. Both types of medicines help with cravings, but the medicines that include nicotine help to relieve withdrawal symptoms. Your health care provider may recommend:  Nicotine patches, gum, or lozenges.  Nicotine inhalers or sprays.  Non-nicotine medicine that is taken by mouth. Talk with your health care provider about combining strategies, such as taking medicines while you are also receiving in-person counseling. Using these two strategies together makes you more likely to succeed in quitting than if you used either strategy on its own. If you are pregnant or breastfeeding, talk with your health care provider about finding counseling or other support strategies to quit smoking. Do not take medicine to help you quit smoking unless told to do so by your health care provider. What things can I do to make it easier to quit? Quitting smoking might feel overwhelming at first, but there is a lot that you can do to make it easier. Take these important actions:  Reach out to your family and friends and ask that they support and encourage you during this time. Call telephone quitlines, reach out to support groups, or work with a counselor for support.  Ask people who smoke to avoid smoking around you.  Avoid places that trigger you to smoke, such as bars, parties, or smoke-break areas at work.  Spend time around people who do not smoke.  Lessen stress in your life, because stress can be a smoking trigger  for some people. To lessen stress, try:  Exercising regularly.  Deep-breathing exercises.  Yoga.  Meditating.  Performing a body scan. This involves closing your eyes, scanning your body from head to toe, and noticing which parts of your body are particularly tense. Purposefully relax the muscles in those areas.  Download or purchase mobile phone or tablet apps (applications) that can help you stick to your quit plan by  providing reminders, tips, and encouragement. There are many free apps, such as QuitGuide from the [employer] Office manager for Disease Control and Prevention). You can find other support for quitting smoking (smoking cessation) through smokefree.gov and other websites. How will I feel when I quit smoking? Within the first 24 hours of quitting smoking, you may start to feel some withdrawal symptoms. These symptoms are usually most noticeable 2-3 days after quitting, but they usually do not last beyond 2-3 weeks. Changes or symptoms that you might experience include:  Mood swings.  Restlessness, anxiety, or irritation.  Difficulty concentrating.  Dizziness.  Strong cravings for sugary foods in addition to nicotine.  Mild weight gain.  Constipation.  Nausea.  Coughing or a sore throat.  Changes in how your medicines work in your body.  A depressed mood.  Difficulty sleeping (insomnia). After the first 2-3 weeks of quitting, you may start to notice more positive results, such as:  Improved sense of smell and taste.  Decreased coughing and sore throat.  Slower heart rate.  Lower blood pressure.  Clearer skin.  The ability to breathe more easily.  Fewer sick days. Quitting smoking is very challenging for most people. Do not get discouraged if you are not successful the first time. Some people need to make many attempts to quit before they achieve long-term success. Do your best to stick to your quit plan, and talk with your health care provider if you have any  questions or concerns. This information is not intended to replace advice given to you by your health care provider. Make sure you discuss any questions you have with your health care provider. Document Released: 01/04/2001 Document Revised: 09/08/2015 Document Reviewed: 05/27/2014 Elsevier Interactive Patient Education  2017 [employer].   IF you received an x-ray today, you will receive an invoice from [facility]. Please contact [facility] at [phone] with questions or concerns regarding your invoice.   IF you received labwork today, you will receive an invoice from [facility]. Please contact [facility] at [phone] with questions or concerns regarding your invoice.   Our billing staff will not be able to assist you with questions regarding bills from these companies.  You will be contacted with the lab results as soon as they are available. The fastest way to get your results is to activate your My Chart account. Instructions are located on the last page of this paperwork. If you have not heard from [place] regarding the results in 2 weeks, please contact this office.

## 2016-04-15 ENCOUNTER — Encounter: Payer: Self-pay | Admitting: Family Medicine

## 2016-04-15 ENCOUNTER — Other Ambulatory Visit: Payer: Self-pay | Admitting: Family Medicine

## 2016-04-15 ENCOUNTER — Telehealth: Payer: Self-pay

## 2016-04-15 DIAGNOSIS — R0609 Other forms of dyspnea: Secondary | ICD-10-CM

## 2016-04-15 DIAGNOSIS — R0789 Other chest pain: Secondary | ICD-10-CM

## 2016-04-15 LAB — COMPREHENSIVE METABOLIC PANEL
ALK PHOS: 95 IU/L (ref 39–117)
ALT: 50 IU/L — AB (ref 0–44)
AST: 34 IU/L (ref 0–40)
Albumin/Globulin Ratio: 2.1 (ref 1.2–2.2)
Albumin: 4.8 g/dL (ref 3.5–5.5)
BUN/Creatinine Ratio: 14 (ref 9–20)
BUN: 14 mg/dL (ref 6–24)
Bilirubin Total: 1.1 mg/dL (ref 0.0–1.2)
CO2: 26 mmol/L (ref 18–29)
CREATININE: 0.98 mg/dL (ref 0.76–1.27)
Calcium: 9.9 mg/dL (ref 8.7–10.2)
Chloride: 98 mmol/L (ref 96–106)
GFR calc Af Amer: 99 mL/min/{1.73_m2} (ref >59)
GFR calc non Af Amer: 85 mL/min/{1.73_m2} (ref >59)
Globulin, Total: 2.3 g/dL (ref 1.5–4.5)
Glucose: 85 mg/dL (ref 65–99)
Potassium: 4.4 mmol/L (ref 3.5–5.2)
Sodium: 140 mmol/L (ref 134–144)
Total Protein: 7.1 g/dL (ref 6.0–8.5)

## 2016-04-15 LAB — LIPID PANEL
CHOLESTEROL TOTAL: 206 mg/dL — AB (ref 100–199)
Chol/HDL Ratio: 5.2 ratio units — ABNORMAL HIGH (ref 0.0–5.0)
HDL: 40 mg/dL (ref >39)
LDL Calculated: 136 mg/dL — ABNORMAL HIGH (ref 0–99)
TRIGLYCERIDES: 151 mg/dL — AB (ref 0–149)
VLDL Cholesterol Cal: 30 mg/dL (ref 5–40)

## 2016-04-15 LAB — PSA: Prostate Specific Ag, Serum: 0.7 ng/mL (ref 0.0–4.0)

## 2016-04-15 MED ORDER — NITROGLYCERIN 0.4 MG SL SUBL: 0.4000 mg | SUBLINGUAL_TABLET | SUBLINGUAL | 0 refills | Status: DC | PRN

## 2016-04-15 NOTE — Telephone Encounter (Signed)
Per Dr. Harrington Challenger, patient is to be scheduled for appointment Monday. He is also to have ECHO scheduled.   Spoke with Dr. Harrington Challenger' RN and scheduled patient Monday at 1215.  ECHO is scheduled 3/30 at [facility].  Left message for patient to call back to confirm appointment times.

## 2016-04-15 NOTE — Addendum Note (Signed)
Addended by: Merri Ray R on: 04/15/2016 04:14 PM   Modules accepted: Orders

## 2016-04-18 ENCOUNTER — Ambulatory Visit (INDEPENDENT_AMBULATORY_CARE_PROVIDER_SITE_OTHER): Payer: Self-pay | Admitting: Internal Medicine

## 2016-04-18 ENCOUNTER — Encounter: Payer: Self-pay | Admitting: Internal Medicine

## 2016-04-18 VITALS — BP 128/86 | HR 75 | Ht 64.0 in | Wt 144.8 lb

## 2016-04-18 DIAGNOSIS — R0789 Other chest pain: Secondary | ICD-10-CM

## 2016-04-18 DIAGNOSIS — R0600 Dyspnea, unspecified: Secondary | ICD-10-CM

## 2016-04-18 MED ORDER — ROSUVASTATIN CALCIUM 10 MG PO TABS: 10.0000 mg | ORAL_TABLET | Freq: Every day | ORAL | 3 refills | Status: AC

## 2016-04-18 NOTE — Patient Instructions (Signed)
Your physician has recommended you make the following change in your medication:  CHANGE ROSUVASTATIN (CRESTOR) TO 10 MG --1 TABLET EVERYDAY Your physician has requested that you have an echocardiogram. Echocardiography is a painless test that uses sound waves to create images of your heart. It provides your doctor with information about the size and shape of your heart and how well your heart's chambers and valves are working. This procedure takes approximately one hour. There are no restrictions for this procedure.  THIS IS ALREADY SCHEDULED.  Follow up with your physician will depend on test results.

## 2016-04-18 NOTE — Progress Notes (Signed)
Cardiology Office Note   Date:  04/18/2016   ID:  [name], DOB 08-Sep-1960, MRN [id number]  PCP:  [name]   Cardiologist:   Dorris Carnes, MD    Pt referred for CP and abnormal EKG     History of Present Illness: [name] is a 56 y.o. male with a history of chest discomfort He was seen by [name] last week  Pt has history of HTN, HL, tobacco use   Seen by J greene  EKG done  Q waves new inferiorly   Pt is a little difficult with history/timing  Has had 2 stress tests in past.   Hotes intermittent episodes of chest pressure  A warm, tingly sensation across chest  Cold air will exacerbate  He initially attrib to lungs since he smoked for a long time  Will get with walking   IN 2016 had spell that was severe  He had binged EtOH and cocaine.  Did not seek medical attention. Since then he has not ridden his bike   Cut back on some activieis   OVer the past month he has had 1 or 2 spells  Again does not push self  Pt says he somes 2 to 2.5 packs per day tob      Current Meds  Medication Sig  . aspirin 81 MG tablet Take 81 mg by mouth daily.  . fish oil-omega-3 fatty acids 1000 MG capsule Take 2 g by mouth daily.  . hydrochlorothiazide (MICROZIDE) 12.5 MG capsule Take 1 capsule (12.5 mg total) by mouth daily.  Marland Kitchen lisinopril (PRINIVIL,ZESTRIL) 5 MG tablet Take 1 tablet (5 mg total) by mouth daily.  . Multiple Vitamin (MULTIVITAMIN) tablet Take 1 tablet by mouth daily.  . nitroGLYCERIN (NITROSTAT) 0.4 MG SL tablet PLACE 1 TABLET UNDER THE TONGUE EVERY 5 MINUTES AS NEEDED FOR CHEST PAIN. IF 2ND DOSE IS NEEDED, CALL 911.  . ranitidine (ZANTAC) 75 MG tablet Take 1 tablet (75 mg total) by mouth daily. (Patient taking differently: Take 150 mg by mouth daily. )  . rosuvastatin (CRESTOR) 10 MG tablet TAKE 1/2 TABLET(5MG ) BY MOUTH DAILY     Allergies:   Patient has no known allergies.   Past Medical History:  Diagnosis Date  . Anxiety   . Cataract   . Depression    . GERD (gastroesophageal reflux disease)   . Hyperlipidemia   . Hypertension     History reviewed. No pertinent surgical history.   Social History:  The patient  reports that he has been smoking.  He has never used smokeless tobacco. He reports that he does not drink alcohol or use drugs.   Family History:  The patient's family history includes Alcohol abuse in his father; Cancer in his maternal grandfather; Diabetes in his paternal grandfather; Heart disease in his maternal grandmother; Hyperlipidemia in his mother; Hypertension in his father, paternal grandfather, and sister; Stroke in his father and paternal grandfather.    ROS:  Please see the history of present illness. All other systems are reviewed and  Negative to the above problem except as noted.    PHYSICAL EXAM: VS:  BP 128/86   Pulse 75   Ht 5\' 4"  (1.626 m)   Wt 144 lb 12.8 oz (65.7 kg)   SpO2 97%   BMI 24.85 kg/m   GEN: Well nourished, well developed, in no acute distress  HEENT: normal  Neck: no JVD, carotid bruits, or masses Cardiac: RRR; no murmurs, rubs, or  gallops,no edema  Respiratory:  clear to auscultation bilaterally, normal work of breathing GI: soft, nontender, nondistended, + BS  No hepatomegaly  MS: no deformity Moving all extremities   Skin: warm and dry, no rash Neuro:  Strength and sensation are intact Psych: euthymic mood, full affect   EKG:  EKG is not ordered today.  Review of EKG on 3/23 SR 68 bpm  Infeiror wall MI   T wave inversion laterally  ISnce previous EKG inferior Q waves new     Lipid Panel    Component Value Date/Time   CHOL 206 (H) 04/14/2016 1710   TRIG 151 (H) 04/14/2016 1710   HDL 40 04/14/2016 1710   CHOLHDL 5.2 (H) 04/14/2016 1710   CHOLHDL 5.8 (H) 08/21/2015 0910   VLDL 39 (H) 08/21/2015 0910   LDLCALC 136 (H) 04/14/2016 1710      Wt Readings from Last 3 Encounters:  04/18/16 144 lb 12.8 oz (65.7 kg)  04/14/16 143 lb 12.8 oz (65.2 kg)  08/12/15 147 lb (66.7  kg)      ASSESSMENT AND PLAN: 1  CP  Very concerning   Episodes have been going on for awhile  I am not convinced  unstable  He has cut back on doing activities   I have counselled on cocaine cessation  Alos on cutting back on tob I would recomm echo to eval LV function, regional wall changes and pulmonary pressures I t  2  HL  Would switch to Crestor for tighter control  3  HTN  Will need to be followed  4  Substance    Current medicines are reviewed at length with the patient today.  The patient does not have concerns regarding medicines.  Signed, Dorris Carnes, MD  04/18/2016 12:42 PM    [address], [place], [place]  [address] Phone: [phone]; Fax: [phone]

## 2016-04-22 ENCOUNTER — Ambulatory Visit (HOSPITAL_COMMUNITY)
Admission: RE | Admit: 2016-04-22 | Discharge: 2016-04-22 | Disposition: A | Discharge status: Home / Self Care | Payer: Self-pay | Source: Ambulatory Visit | Attending: Internal Medicine | Admitting: Internal Medicine

## 2016-04-22 DIAGNOSIS — I340 Nonrheumatic mitral (valve) insufficiency: Secondary | ICD-10-CM | POA: Insufficient documentation

## 2016-04-22 DIAGNOSIS — R0789 Other chest pain: Secondary | ICD-10-CM

## 2016-04-22 DIAGNOSIS — I5189 Other ill-defined heart diseases: Secondary | ICD-10-CM | POA: Insufficient documentation

## 2016-04-22 DIAGNOSIS — R0609 Other forms of dyspnea: Secondary | ICD-10-CM

## 2016-04-22 NOTE — Progress Notes (Signed)
  Echocardiogram 2D Echocardiogram has been performed.  Tresa Res 04/22/2016, 10:35 AM

## 2016-05-06 ENCOUNTER — Telehealth: Payer: Self-pay | Admitting: Internal Medicine

## 2016-05-06 NOTE — Telephone Encounter (Signed)
New message ° ° ° ° ° °Returning a call to the nurse to get echo results °

## 2016-05-10 NOTE — Telephone Encounter (Signed)
Notes recorded by Rodman Key, RN on 04/28/2016 at 6:12 PM EDT Unable to reach patient. Left message for him to call back to discuss. ------  Notes recorded by Fay Records, MD on 04/22/2016 at 5:31 PM EDT I have reviewed echo LVEF is mildly depressed Some walls not moving as good as others, esp underside of heart  Discussed with pt  I would recomm cath With possible intervention depending on findings  Pt would like to reflect Will call or if he doesn't, we will call back next week Need to check on any pt assistance         Left a detailed message today for patient to call back.  Advised there are financial assistance forms at the checkout if needed and that I am following up on the echo results and recommendation for cardiac catheterization.

## 2016-05-24 NOTE — Telephone Encounter (Signed)
Left detailed message again that there are financial assistance forms at the front desk of our office if needed and to please call back if/when he chooses to proceed with cardiac catheterization that is recommended by Dr. Harrington Challenger.

## 2017-09-26 IMAGING — DX DG CHEST 2V
2 series · 2 of 2 positions shown · non-contrast
Comparison: none

CLINICAL DATA: episodic dyspnea. Long time smoker.

EXAM:
CHEST - 2 VIEW

[chest pa]
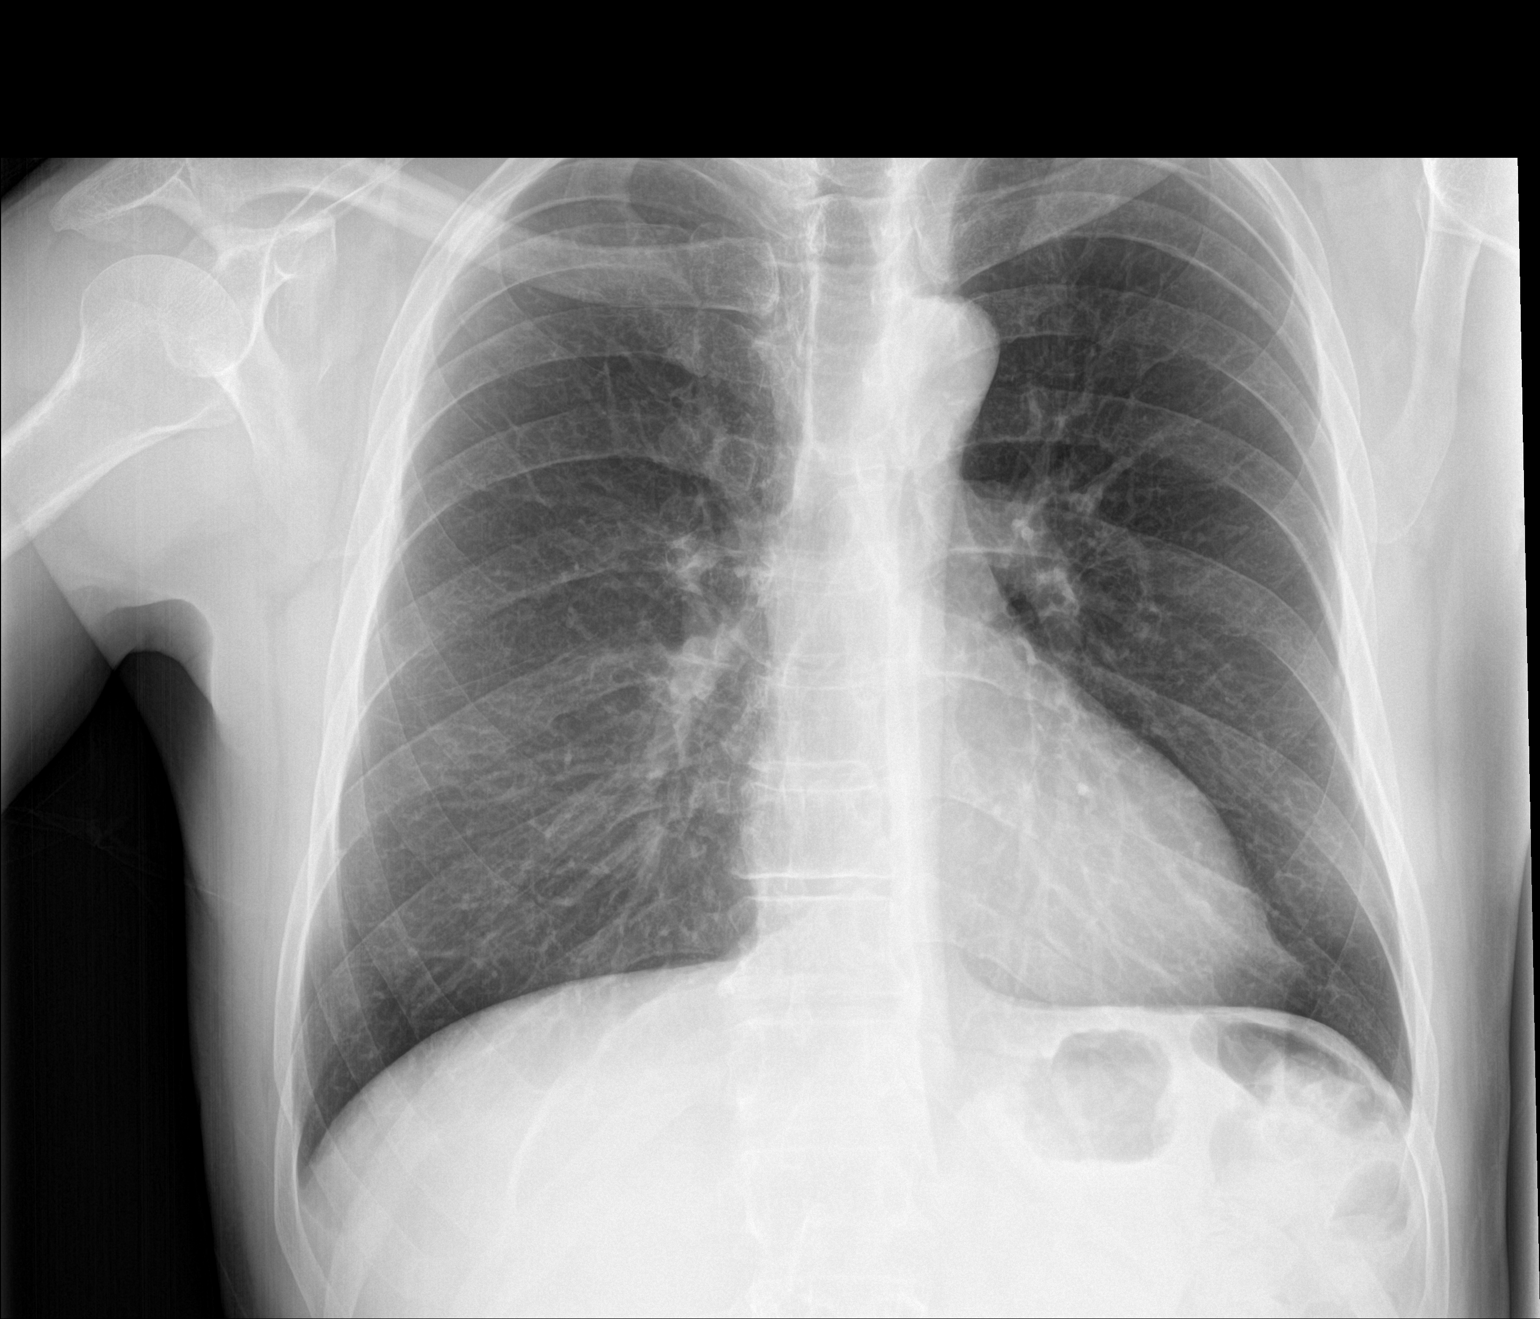

[chest lat]
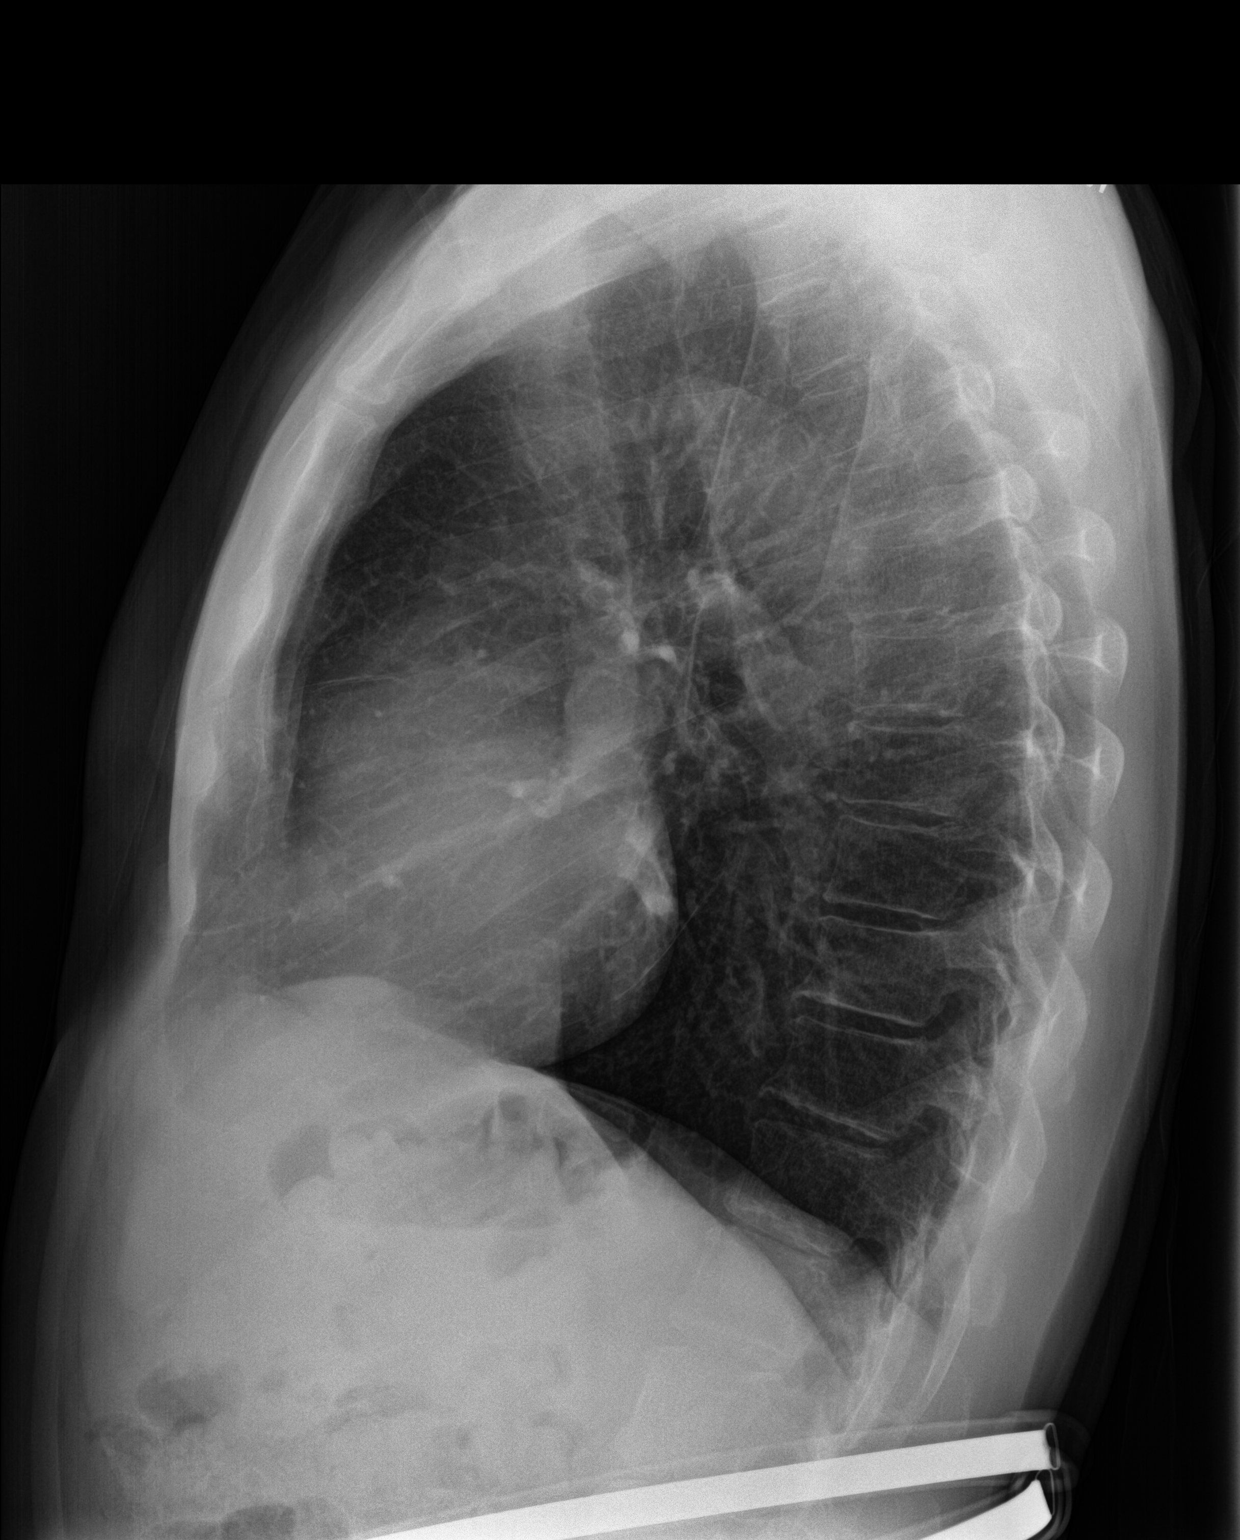

[2 of 2 positions shown; findings below may reference images not displayed]

FINDINGS: Lungs are clear, mildly hyperinflated.

Heart size and mediastinal contours are within normal limits.

No effusion.

Visualized bones unremarkable.
IMPRESSION: No acute cardiopulmonary disease.
